# Patient Record
Sex: Female | Born: 1990 | Hispanic: Yes | Smoking: Never smoker
Health system: Southern US, Community
[De-identification: ages and names within clinical notes are randomized; demographics above are authoritative.]

## PROBLEM LIST (undated history)

## (undated) DIAGNOSIS — O24419 Gestational diabetes mellitus in pregnancy, unspecified control: Secondary | ICD-10-CM

## (undated) HISTORY — DX: Gestational diabetes mellitus in pregnancy, unspecified control: O24.419

---

## 2016-03-31 DIAGNOSIS — Q909 Down syndrome, unspecified: Secondary | ICD-10-CM

## 2018-02-05 NOTE — L&D Delivery Note (Signed)
Delivery Note At 5:47 PM a viable and healthy female "[name]" was delivered via Vaginal, Spontaneous (Presentation: Direct OA ).  APGAR: 8, 9; weight pending.   Placenta status: spontaneousl, intact.  Cord:3 VC with the following complications: none, though very little blood in cord and no cord pH able to be collected.  Anesthesia: epidural   Episiotomy: None Lacerations: None Est. Blood Loss (mL):  300  Mom to postpartum.  Baby to Couplet care / Skin to Skin.  28yo G2P1001 at 39+2wks presented in active labor. She was AROM for clear fluid at 14:50, and progressed to fully dilated. She pushed over intact perineum with an epidural for a short 2nd stage and delivered the fetal head, followed, with some effort by the fetal body. No shoulder dystocia. The baby was vigorous and crying, and placed on maternal abdomen. Delayed cord clamping and FOB cut the cord. Placenta delivered spontaneously and intact. Tiny right superior labial tear that was hemostatic and needed no suture repair. Mom and baby doing well. Fundus firm, pitocin running.   Benjaman Kindler 12/15/2018, 5:59 PM

## 2018-05-12 ENCOUNTER — Other Ambulatory Visit: Payer: Self-pay | Admitting: Family Medicine

## 2018-05-12 DIAGNOSIS — Z369 Encounter for antenatal screening, unspecified: Secondary | ICD-10-CM

## 2018-06-07 LAB — OB RESULTS CONSOLE GC/CHLAMYDIA
Chlamydia: NEGATIVE
Gonorrhea: NEGATIVE

## 2018-06-07 LAB — OB RESULTS CONSOLE RUBELLA ANTIBODY, IGM: Rubella: IMMUNE

## 2018-06-07 LAB — OB RESULTS CONSOLE ABO/RH: RH Type: POSITIVE

## 2018-06-07 LAB — OB RESULTS CONSOLE HGB/HCT, BLOOD: Hemoglobin: 13.3

## 2018-06-07 LAB — OB RESULTS CONSOLE VARICELLA ZOSTER ANTIBODY, IGG: Varicella: IMMUNE

## 2018-06-07 LAB — OB RESULTS CONSOLE ANTIBODY SCREEN: Antibody Screen: NEGATIVE

## 2018-06-07 LAB — SICKLE CELL SCREEN: Sickle Cell Screen: NEGATIVE

## 2018-06-07 LAB — OB RESULTS CONSOLE HEPATITIS B SURFACE ANTIGEN: Hepatitis B Surface Ag: NEGATIVE

## 2018-06-07 LAB — OB RESULTS CONSOLE RPR: RPR: NONREACTIVE

## 2018-06-07 LAB — OB RESULTS CONSOLE PLATELET COUNT: Platelets: 325

## 2018-06-07 LAB — OB RESULTS CONSOLE HIV ANTIBODY (ROUTINE TESTING): HIV: NONREACTIVE

## 2018-06-09 ENCOUNTER — Other Ambulatory Visit: Payer: Self-pay

## 2018-06-09 ENCOUNTER — Ambulatory Visit
Admission: RE | Admit: 2018-06-09 | Discharge: 2018-06-09 | Disposition: A | Discharge status: Home / Self Care | Payer: Self-pay | Source: Ambulatory Visit | Attending: Obstetrics and Gynecology | Admitting: Obstetrics and Gynecology

## 2018-06-09 DIAGNOSIS — Z8279 Family history of other congenital malformations, deformations and chromosomal abnormalities: Secondary | ICD-10-CM

## 2018-06-09 NOTE — Progress Notes (Signed)
Virtual Visit via Telephone Note  I connected with Iris Junius FinnerYessenia Munoz Cedillos on Jun 09, 2018 at 11:00 AM EDT by telephone and verified that I am speaking with the correct person using two identifiers.  Referring Provider:  Federico FlakeNewton, Kimberly Niles Length of Consultation: 40 minutes   Ms. Constance HolsterMunoz Cedillos was referred to [facility] for genetic counseling to discuss her family history and prenatal testing options.  This note is a summary of our discussion.  We first obtained a detailed family history. Ms. Constance HolsterMunoz Cedillos reported that her first child, Darwing Benson Norwaymil Perez Munoz, has Down syndrome. He is currently [age] years old and was diagnosed at birth in [place].  [facility] is [facility], where he had surgery for a cardiac defect.  With our patients permission, a review of his labs confirmed the diagnosis of trisomy 21 (47,XY +21) [facility]. There is no other family history of children with birth defects, developmental delays, multiple miscarriages or chromosome conditions.  She also denied any complications during this pregnancy or exposures to medications, recreational drugs, tobacco or alcohol. She reported that her husband has a sister with a tumor on her foot in her teens.  No additional information is known.  If more is learned about the type of tumor or cause for her condition, we are happy to discuss this further.  We reviewed that chromosomes are the inherited structures that contain our instructions for development (genes).  Each cell of our body normally has 46 chromosomes, matched up into 23 pairs.  The last pair determines our gender and are called the sex chromosomes.  A female has an X and a Y chromosome, while a female has two X chromosomes.  Rarely, when a mother's egg and father's sperm unite, an extra or missing chromosome can be passed on to the baby by mistake.  Changes in the number or the structure of the chromosomes may result in a child with some  degree of mental retardation and physical problems.  Down syndrome is caused by having three copies (instead of the usual two copies) of the genes on chromosome number 21.  There are two types of Down syndrome.  Most often (about 95% of the time), Down syndrome is caused by an entire third copy of chromosome 21, known as Trisomy 1321.  In the other cases, Down syndrome is caused by a rearrangement of the chromosomes, known as a translocation.    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  We would estimated that the chance for Ms. Maryelizabeth KaufmannMunoz Cedillos to have another child with Down syndrome or another chromosome condition is 1%. This means that there is therefore a 99% chance that this pregnancy would NOT have a chromosome difference.  We discussed the following prenatal screening and testing options for this pregnancy:  Cell free fetal DNA testing from maternal blood to may be used to determine whether or not the baby may have either Down syndrome, trisomy 6213, or trisomy 7818.  This test utilizes a maternal blood sample and DNA sequencing technology to isolate circulating cell free fetal DNA from maternal plasma.  The fetal DNA can then be analyzed for DNA sequences that are derived from the three most common chromosomes involved in aneuploidy, chromosomes 13, 18, and 21.  If the overall amount of DNA is greater than the expected  level for any of these chromosomes, aneuploidy is suspected.  While we do not consider it a replacement for invasive testing and karyotype analysis, a negative result from this testing would be reassuring, though not a guarantee of a normal chromosome complement for the baby.  An abnormal result is certainly suggestive of an abnormal chromosome complement, though we would still recommend CVS or amniocentesis to confirm any findings from this testing.  The  chorionic villus sampling procedure is available for first trimester chromosome analysis.  This involves the withdrawal of a small amount of chorionic villi (tissue from the developing placenta).  Risk of pregnancy loss is estimated to be approximately 1 in 200 to 1 in 100 (0.5 to 1%).  There is approximately a 1% (1 in 100) chance that the CVS chromosome results will be unclear.  Chorionic villi cannot be tested for neural tube defects.     Targeted ultrasound uses high frequency sound waves to create an image of the developing fetus.  An ultrasound is often recommended as a routine means of evaluating the pregnancy.  It is also used to screen for fetal anatomy problems (for example, a heart defect) that might be suggestive of a chromosomal or other abnormality.   Amniocentesis involves the removal of a small amount of amniotic fluid from the sac surrounding the fetus ("bag of water") with the use of a thin needle inserted through the mother's abdomen and uterus.  Ultrasound guidance is used throughout the procedure.  Fetal cells from amniotic fluid are directly evaluated and > 99.5% of chromosome problems and > 98% of open neural tube defects can be detected. This procedure is generally performed after the 15th week of pregnancy.  The main risks to this procedure include complications leading to miscarriage in less than 1 in 200 cases (0.5%).  First trimester screening and second trimester multiple marker screening were not discussed in detail, as cell free fetal DNA testing is more accurate for chromosome conditions.  Materal serum AFP only should be considered to assess for open neural tube defects.  Cystic Fibrosis and Spinal Muscular Atrophy (SMA) screening were also discussed with the patient. Both conditions are recessive, which means that both parents must be carriers in order to have a child with the disease.  Cystic fibrosis (CF) is one of the most common genetic conditions in persons of Caucasian  ancestry.  This condition occurs in approximately 1 in 2,500 Caucasian persons and results in thickened secretions in the lungs, digestive, and reproductive systems.  For a baby to be at risk for having CF, both of the parents must be carriers for this condition.  Approximately 1 in 79 Caucasian persons is a carrier for CF.  Current carrier testing looks for the most common mutations in the gene for CF and can detect approximately 90% of carriers in the Caucasian population.  This means that the carrier screening can greatly reduce, but cannot eliminate, the chance for an individual to have a child with CF.  If an individual is found to be a carrier for CF, then carrier testing would be available for the partner. As part of [place]'s newborn screening profile, all babies born in the state of [place] will have a two-tier screening process.  Specimens are first tested to determine the concentration of immunoreactive trypsinogen (IRT).  The top 5% of specimens with the highest IRT values then undergo DNA testing using a panel of over 40 common CF mutations. SMA is a neurodegenerative disorder that  leads to atrophy of skeletal muscle and overall weakness.  This condition is also more prevalent in the Caucasian population, with 1 in 40-1 in 60 persons being a carrier and 1 in 6,000-1 in 10,000 children being affected.  There are multiple forms of the disease, with some causing death in infancy to other forms with survival into adulthood.  The genetics of SMA is complex, but carrier screening can detect up to 95% of carriers in the Caucasian population.  Similar to CF, a negative result can greatly reduce, but cannot eliminate, the chance to have a child with SMA.  Hemoglobinopathy screening was also offered. The patient and her partner are both from [place].  We discussed the prenatal options in detail and after consideration, the patient elected to proceed with MaterniT21 PLUS with SCA to be drawn  following her ultrasound at [facility] of  on Thursday, May 7 at 1:00pm.  She declined carrier screening for hemoglobinopathies, CF and SMA.  We appreciate very much the opportunity to be involved with the care of this patient.  If the patient has further questions or concerns, please do not hesitate to call us at [phone].  Cherly Anderson, MS, CGC  Labs drawn: None today, to be drawn 5/7 - MaterniT21 PLUS with SCA  I provided 40 minutes of non-face-to-face time during this encounter.   Katrina Stack

## 2018-06-12 ENCOUNTER — Other Ambulatory Visit: Payer: Self-pay

## 2018-06-12 ENCOUNTER — Ambulatory Visit: Payer: Self-pay

## 2018-06-12 ENCOUNTER — Ambulatory Visit
Admission: RE | Admit: 2018-06-12 | Discharge: 2018-06-12 | Disposition: A | Discharge status: Home / Self Care | Payer: Self-pay | Source: Ambulatory Visit | Attending: Family Medicine | Admitting: Family Medicine

## 2018-06-12 DIAGNOSIS — Z3689 Encounter for other specified antenatal screening: Secondary | ICD-10-CM | POA: Insufficient documentation

## 2018-06-12 DIAGNOSIS — Z3A12 12 weeks gestation of pregnancy: Secondary | ICD-10-CM | POA: Insufficient documentation

## 2018-06-12 DIAGNOSIS — Z369 Encounter for antenatal screening, unspecified: Secondary | ICD-10-CM

## 2018-06-16 LAB — MATERNIT21 PLUS CORE+SCA
Fetal Fraction: 10
Monosomy X (Turner Syndrome): NOT DETECTED
Result (T21): NEGATIVE
Trisomy 13 (Patau syndrome): NEGATIVE
Trisomy 18 (Edwards syndrome): NEGATIVE
Trisomy 21 (Down syndrome): NEGATIVE
XXX (Triple X Syndrome): NOT DETECTED
XXY (Klinefelter Syndrome): NOT DETECTED
XYY (Jacobs Syndrome): NOT DETECTED

## 2018-06-16 NOTE — Progress Notes (Signed)
Hx reviewed by me, agree with assessment and plan as outlined in [facility] Wells's note. 

## 2018-06-19 ENCOUNTER — Telehealth: Payer: Self-pay | Admitting: Obstetrics and Gynecology

## 2018-06-19 NOTE — Telephone Encounter (Signed)
The patient was informed of the results of her recent MaterniT21 testing which yielded NEGATIVE results.  The patient's specimen showed DNA consistent with two copies of chromosomes 21, 18 and 13.  The sensitivity for trisomy 21, trisomy 18 and trisomy 13 using this testing are reported as 99.1%, 99.9% and 91.7% respectively.  Thus, while the results of this testing are highly accurate, they are not considered diagnostic at this time.  Should more definitive information be desired, the patient may still consider amniocentesis.   As requested to know by the patient, sex chromosome analysis was included for this sample.  Results are consistent with a female fetus (no Y chromosome material detected). This is predicted with >99% accuracy.  A maternal serum AFP only should be considered if screening for neural tube defects is desired.  We may be reached at [phone] with any questions or concerns.   Jenita Rayfield F. Asbury Hair, MS, CGC   

## 2018-07-21 ENCOUNTER — Other Ambulatory Visit: Payer: Self-pay

## 2018-07-21 DIAGNOSIS — Z8279 Family history of other congenital malformations, deformations and chromosomal abnormalities: Secondary | ICD-10-CM

## 2018-07-24 ENCOUNTER — Other Ambulatory Visit: Payer: Self-pay

## 2018-07-24 ENCOUNTER — Ambulatory Visit
Admission: RE | Admit: 2018-07-24 | Discharge: 2018-07-24 | Disposition: A | Discharge status: Home / Self Care | Payer: Self-pay | Source: Ambulatory Visit | Attending: Obstetrics and Gynecology | Admitting: Obstetrics and Gynecology

## 2018-07-24 DIAGNOSIS — Z8279 Family history of other congenital malformations, deformations and chromosomal abnormalities: Secondary | ICD-10-CM | POA: Insufficient documentation

## 2018-07-24 DIAGNOSIS — Z362 Encounter for other antenatal screening follow-up: Secondary | ICD-10-CM | POA: Insufficient documentation

## 2018-07-24 DIAGNOSIS — Z3A18 18 weeks gestation of pregnancy: Secondary | ICD-10-CM | POA: Insufficient documentation

## 2018-08-04 ENCOUNTER — Other Ambulatory Visit: Payer: Self-pay

## 2018-08-04 DIAGNOSIS — Z8279 Family history of other congenital malformations, deformations and chromosomal abnormalities: Secondary | ICD-10-CM

## 2018-08-07 ENCOUNTER — Ambulatory Visit
Admission: RE | Admit: 2018-08-07 | Discharge: 2018-08-07 | Disposition: A | Discharge status: Home / Self Care | Payer: Self-pay | Source: Ambulatory Visit | Attending: Obstetrics and Gynecology | Admitting: Obstetrics and Gynecology

## 2018-08-07 ENCOUNTER — Other Ambulatory Visit: Payer: Self-pay

## 2018-08-07 DIAGNOSIS — Z8279 Family history of other congenital malformations, deformations and chromosomal abnormalities: Secondary | ICD-10-CM | POA: Insufficient documentation

## 2018-08-07 DIAGNOSIS — Z3689 Encounter for other specified antenatal screening: Secondary | ICD-10-CM | POA: Insufficient documentation

## 2018-08-07 DIAGNOSIS — Z3A20 20 weeks gestation of pregnancy: Secondary | ICD-10-CM | POA: Insufficient documentation

## 2018-08-18 ENCOUNTER — Encounter: Payer: Self-pay | Admitting: Family Medicine

## 2018-08-18 DIAGNOSIS — Z8759 Personal history of other complications of pregnancy, childbirth and the puerperium: Secondary | ICD-10-CM | POA: Insufficient documentation

## 2018-08-18 DIAGNOSIS — O0990 Supervision of high risk pregnancy, unspecified, unspecified trimester: Secondary | ICD-10-CM

## 2018-08-18 DIAGNOSIS — Z8279 Family history of other congenital malformations, deformations and chromosomal abnormalities: Secondary | ICD-10-CM

## 2018-08-29 ENCOUNTER — Other Ambulatory Visit: Payer: Self-pay

## 2018-08-29 ENCOUNTER — Ambulatory Visit: Payer: Medicaid Other

## 2018-08-29 ENCOUNTER — Ambulatory Visit: Payer: Self-pay | Admitting: Family Medicine

## 2018-08-29 DIAGNOSIS — Z862 Personal history of diseases of the blood and blood-forming organs and certain disorders involving the immune mechanism: Secondary | ICD-10-CM

## 2018-08-29 DIAGNOSIS — Z8759 Personal history of other complications of pregnancy, childbirth and the puerperium: Secondary | ICD-10-CM

## 2018-08-29 DIAGNOSIS — R87610 Atypical squamous cells of undetermined significance on cytologic smear of cervix (ASC-US): Secondary | ICD-10-CM

## 2018-08-29 DIAGNOSIS — O0990 Supervision of high risk pregnancy, unspecified, unspecified trimester: Secondary | ICD-10-CM

## 2018-08-29 NOTE — Progress Notes (Signed)
   TELEPHONE OBSTETRICS VISIT ENCOUNTER NOTE  I connected with@ on 08/29/18 at 10:20 AM EDT by telephone at home and verified that I am speaking with the correct person using two identifiers.   I discussed the limitations, risks, security and privacy concerns of performing an evaluation and management service by telephone and the availability of in person appointments. I also discussed with the patient that there may be a patient responsible charge related to this service. The patient expressed understanding and agreed to proceed.  Subjective:  Tracey Thompson is a 28 y.o. G2P1001 at [redacted]w[redacted]d being followed for ongoing prenatal care.  She is currently monitored for the following issues for this low-risk pregnancy and has Family history of Down syndrome; Supervision of high risk pregnancy, antepartum; and History of postpartum hemorrhage on their problem list.  Patient reports no complaints. Reports fetal movement. Denies any contractions, bleeding. See RN note. Patient reported leaking and I reviewed sx with patient this is likely normal vaginal discharge vs urine. Patient reassured  The following portions of the patient's history were reviewed and updated as appropriate: allergies, current medications, past family history, past medical history, past social history, past surgical history and problem list.   Objective:   General:  Alert, oriented and cooperative.   Mental Status: Normal mood and affect perceived. Normal judgment and thought content.  Rest of physical exam deferred due to type of encounter  Assessment and Plan:  Pregnancy: G2P1001 at [redacted]w[redacted]d 1. Supervision of high risk pregnancy, antepartum Up to date Next appt will have 28 wk labs Discussed testing to be done at next visit   2. History of postpartum hemorrhage LD aware  Continue to monitor anemia to assure hgb is maximized prior to delivery  Preterm labor symptoms and general obstetric precautions including but  not limited to vaginal bleeding, contractions, leaking of fluid and fetal movement were reviewed in detail with the patient.  I discussed the assessment and treatment plan with the patient. The patient was provided an opportunity to ask questions and all were answered. The patient agreed with the plan and demonstrated an understanding of the instructions. The patient was advised to call back or seek an in-person office evaluation/go to the hospital for any urgent or concerning symptoms.  Please refer to After Visit Summary for other counseling recommendations.   I provided 7 minutes of non-face-to-face time during this encounter.  Return in about 4 weeks (around 09/26/2018) for Routine prenatal care, 28 wk labs.  Future Appointments  Date Time Provider [place]  09/26/2018  2:20 PM [facility] PROVIDER [facility] None    Caren Macadam, MD

## 2018-08-29 NOTE — Progress Notes (Signed)
Call to client regarding Telehealth visit; confirmed pt. Identity and discussed limitations of visit and informed is a billable visit-agrees to continue with visit; undecided on [facility]; informed of labs needed @ next visit; taking PNV; CCNC/ PHQ9 screening reviewed via interpreter. C/o panties "feeling wet" frequently-denies clothes being wet or anything running down legs-has tried using pantyliner and thinks it is urine--discussed Kegel's and advised if ?ROM to call [facility] or L & D-agreed.

## 2018-09-26 ENCOUNTER — Encounter: Payer: Self-pay | Admitting: Advanced Practice Midwife

## 2018-09-26 ENCOUNTER — Other Ambulatory Visit: Payer: Self-pay

## 2018-09-26 ENCOUNTER — Ambulatory Visit: Payer: Medicaid Other

## 2018-09-26 ENCOUNTER — Ambulatory Visit: Payer: Self-pay | Admitting: Advanced Practice Midwife

## 2018-09-26 VITALS — BP 111/69 | Temp 97.5°F | Wt 167.2 lb

## 2018-09-26 DIAGNOSIS — O0990 Supervision of high risk pregnancy, unspecified, unspecified trimester: Secondary | ICD-10-CM

## 2018-09-26 DIAGNOSIS — Z3483 Encounter for supervision of other normal pregnancy, third trimester: Secondary | ICD-10-CM

## 2018-09-26 LAB — HEMOGLOBIN, FINGERSTICK: Hemoglobin: 13.2 g/dL (ref 11.1–15.9)

## 2018-09-26 LAB — OB RESULTS CONSOLE HIV ANTIBODY (ROUTINE TESTING): HIV: NONREACTIVE

## 2018-09-26 NOTE — Progress Notes (Signed)
   PRENATAL VISIT NOTE  Subjective:  Tracey Thompson is a 28 y.o. G2P1001 at [redacted]w[redacted]d being seen today for ongoing prenatal care.  She is currently monitored for the following issues for this low-risk pregnancy and has Family history of Down syndrome; Supervision of high risk pregnancy, antepartum; History of postpartum hemorrhage; and ASCUS of cervix with negative high risk HPV on their problem list.  Patient reports no complaints.  Contractions: Not present. Vag. Bleeding: None.  Movement: Present. Denies leaking of fluid/ROM.   The following portions of the patient's history were reviewed and updated as appropriate: allergies, current medications, past family history, past medical history, past social history, past surgical history and problem list. Problem list updated.  Objective:   Vitals:   09/26/18 1422  BP: 111/69  Temp: (!) 97.5 F (36.4 C)  Weight: 167 lb 3.2 oz (75.8 kg)    Fetal Status: Fetal Heart Rate (bpm): 140 Fundal Height: 28 cm Movement: Present     General:  Alert, oriented and cooperative. Patient is in no acute distress.  Skin: Skin is warm and dry. No rash noted.   Cardiovascular: Normal heart rate noted  Respiratory: Normal respiratory effort, no problems with respiration noted  Abdomen: Soft, gravid, appropriate for gestational age.  Pain/Pressure: Absent     Pelvic: Cervical exam deferred        Extremities: Normal range of motion.  Edema: None  Mental Status: Normal mood and affect. Normal behavior. Normal judgment and thought content.   Assessment and Plan:  Pregnancy: G2P1001 at [redacted]w[redacted]d  1. Supervision of high risk pregnancy, antepartum Feels well. Unemployed.  1 hr glucola today.  To receive BP cuff for Centering   Preterm labor symptoms and general obstetric precautions including but not limited to vaginal bleeding, contractions, leaking of fluid and fetal movement were reviewed in detail with the patient. Please refer to After Visit  Summary for other counseling recommendations.  Return in about 2 weeks (around 10/10/2018) for telehealth, routine PNC.  No future appointments.  Herbie Saxon, CNM

## 2018-09-26 NOTE — Addendum Note (Signed)
Addended by: Jenetta Downer on: 09/26/2018 04:58 PM   Modules accepted: Orders

## 2018-09-26 NOTE — Progress Notes (Signed)
Patient here at 68 6/7, for 28 week labs and Tdap. Tdap given left deltoid, tolerated well, VIS given. Patient to schedule next appointment for Telehealth, and patient states she does not have a BP cuff. Per Ellison Carwin, BP cuffs are for Centering patients only at this time.Jenetta Downer, RN

## 2018-09-27 LAB — RPR: RPR Ser Ql: NONREACTIVE

## 2018-09-27 LAB — GLUCOSE, 1 HOUR GESTATIONAL: Gestational Diabetes Screen: 202 mg/dL — ABNORMAL HIGH (ref 65–139)

## 2018-09-29 ENCOUNTER — Telehealth: Payer: Self-pay

## 2018-09-29 DIAGNOSIS — O99810 Abnormal glucose complicating pregnancy: Secondary | ICD-10-CM | POA: Insufficient documentation

## 2018-09-29 NOTE — Telephone Encounter (Signed)
Call to client-discussed abnl 1 hr and scheduled for 3 Hr Gtt 10/02/18; instructions given via interpreter M. [place] Tracey Mccosh, RN

## 2018-10-02 ENCOUNTER — Other Ambulatory Visit: Payer: Self-pay

## 2018-10-02 VITALS — Temp 97.4°F

## 2018-10-02 DIAGNOSIS — O0990 Supervision of high risk pregnancy, unspecified, unspecified trimester: Secondary | ICD-10-CM

## 2018-10-02 NOTE — Progress Notes (Signed)
Patient in nurse clinic today for 3 hour GTT. Instructions given, patient sent to lab. No history of travel noted. Matvey Llanas, RN   

## 2018-10-03 LAB — GLUCOSE TOLERANCE TEST, 6 HOUR
Glucose, 1 Hour GTT: 190 mg/dL (ref 65–199)
Glucose, 2 hour: 173 mg/dL — ABNORMAL HIGH (ref 65–139)
Glucose, 3 hour: 140 mg/dL — ABNORMAL HIGH (ref 65–109)
Glucose, GTT - Fasting: 88 mg/dL (ref 65–99)

## 2018-10-06 ENCOUNTER — Other Ambulatory Visit: Payer: Self-pay | Admitting: Advanced Practice Midwife

## 2018-10-06 ENCOUNTER — Telehealth: Payer: Self-pay

## 2018-10-06 DIAGNOSIS — O24410 Gestational diabetes mellitus in pregnancy, diet controlled: Secondary | ICD-10-CM

## 2018-10-06 DIAGNOSIS — O24419 Gestational diabetes mellitus in pregnancy, unspecified control: Secondary | ICD-10-CM | POA: Insufficient documentation

## 2018-10-06 MED ORDER — BLOOD GLUCOSE MONITOR KIT: PACK | 0 refills | Status: DC

## 2018-10-06 NOTE — Telephone Encounter (Signed)
Call to client, per E. Sciora, CNM, & discussed GDM dg. Via interpreter M. [place] informed of Lifestyle referral and instructions given Debera Lat, RN

## 2018-10-10 ENCOUNTER — Ambulatory Visit: Payer: Self-pay | Admitting: Advanced Practice Midwife

## 2018-10-10 DIAGNOSIS — O0990 Supervision of high risk pregnancy, unspecified, unspecified trimester: Secondary | ICD-10-CM

## 2018-10-10 DIAGNOSIS — O24410 Gestational diabetes mellitus in pregnancy, diet controlled: Secondary | ICD-10-CM

## 2018-10-10 NOTE — Progress Notes (Signed)
   TELEPHONE OBSTETRICS VISIT ENCOUNTER NOTE  I connected with Tracey Thompson @ on 10/10/18 at  2:40 PM EDT by telephone at gas station and verified that I am speaking with the correct person using two identifiers.   I discussed the limitations, risks, security and privacy concerns of performing an evaluation and management service by telephone and the availability of in person appointments. I also discussed with the patient that there may be a patient responsible charge related to this service. The patient expressed understanding and agreed to proceed.  Subjective:  Tracey Krithi Bray is a 28 y.o. G2P1001 at [redacted]w[redacted]d being followed for ongoing prenatal care.  She is currently monitored for the following issues for this high-risk pregnancy and has Family history of Down syndrome; Supervision of high risk pregnancy, antepartum; History of postpartum hemorrhage; ASCUS of cervix with negative high risk HPV; Abnormal glucose tolerance test during pregnancy, antepartum; and Gestational diabetes mellitus (GDM), antepartum on their problem list.  Patient reports no complaints. Reports fetal movement. Denies any contractions, bleeding or leaking of fluid.   The following portions of the patient's history were reviewed and updated as appropriate: allergies, current medications, past family history, past medical history, past social history, past surgical history and problem list.   Objective:   General:  Alert, oriented and cooperative.   Mental Status: Normal mood and affect perceived. Normal judgment and thought content.  Rest of physical exam deferred due to type of encounter  Assessment and Plan:  Pregnancy: G2P1001 at [redacted]w[redacted]d 1. Diet controlled gestational diabetes mellitus (GDM), antepartum Newly diagnosed gestational diabetic.  Pt plans to go to [facility] appt on 10/16/18  2. Supervision of high risk pregnancy, antepartum Feels well.  Will be seen weekly in clinic due to  diabetes dx.  Preterm labor symptoms and general obstetric precautions including but not limited to vaginal bleeding, contractions, leaking of fluid and fetal movement were reviewed in detail with the patient.  I discussed the assessment and treatment plan with the patient. The patient was provided an opportunity to ask questions and all were answered. The patient agreed with the plan and demonstrated an understanding of the instructions. The patient was advised to call back or seek an in-person office evaluation/go to the hospital for any urgent or concerning symptoms.  Please refer to After Visit Summary for other counseling recommendations.   I provided 9 minutes of non-face-to-face time during this encounter.  Return in about 1 week (around 10/17/2018) for Needs u/a and make a copy of blood sugar log.  Future Appointments  Date Time Provider [place]  10/16/2018  1:45 PM Shot[place], Guadalupe Maple, RN [facility] None  10/24/2018  3:20 PM [facility] PROVIDER [facility] None    Herbie Saxon, CNM

## 2018-10-10 NOTE — Progress Notes (Signed)
Call for [facility] Telehealth visit; confirmed identity; aware of 10/16/18 [facility] appt; denies hospital visits; undecided on [facility] Debera Lat, RN

## 2018-10-16 ENCOUNTER — Encounter: Payer: Self-pay | Admitting: *Deleted

## 2018-10-16 ENCOUNTER — Encounter: Payer: Self-pay | Attending: Advanced Practice Midwife | Admitting: *Deleted

## 2018-10-16 ENCOUNTER — Other Ambulatory Visit: Payer: Self-pay

## 2018-10-16 VITALS — BP 104/70 | Ht 63.0 in | Wt 165.8 lb

## 2018-10-16 DIAGNOSIS — Z3A00 Weeks of gestation of pregnancy not specified: Secondary | ICD-10-CM | POA: Insufficient documentation

## 2018-10-16 DIAGNOSIS — O24410 Gestational diabetes mellitus in pregnancy, diet controlled: Secondary | ICD-10-CM | POA: Insufficient documentation

## 2018-10-16 NOTE — Progress Notes (Signed)
Diabetes Self-Management Education  Visit Type: First/Initial  Appt. Start Time: 1345 Appt. End Time: 1540  10/16/2018  [name], identified by name and date of birth, is a 28 y.o. female with a diagnosis of Diabetes: Gestational Diabetes.   ASSESSMENT  Blood pressure 104/70, height 5\' 3"  (1.6 m), weight 165 lb 12.8 oz (75.2 kg), last menstrual period 03/15/2018, estimated date of delivery 12/20/2018 Body mass index is 29.37 kg/m.  Diabetes Self-Management Education - 10/16/18 1635      Visit Information   Visit Type  First/Initial      Initial Visit   Diabetes Type  Gestational Diabetes    Are you currently following a meal plan?  Yes    What type of meal plan do you follow?  "stopped eating so much rice and sugary beverages"    Are you taking your medications as prescribed?  Yes    Date Diagnosed  2 weeks ago      Health Coping   How would you rate your overall health?  Good      Psychosocial Assessment   Patient Belief/Attitude about Diabetes  Other (comment)   "concerned"   Self-care barriers  English as a second language    Self-management support  Doctor's office;Family    Other persons present  Interpreter    Patient Concerns  Nutrition/Meal planning;Healthy Lifestyle;Monitoring    Special Needs  Other (comment)   education materials in Spanish   Preferred Learning Style  Auditory;Visual    Learning Readiness  Change in progress    How often do you need to have someone help you when you read instructions, pamphlets, or other written materials from your doctor or pharmacy?  2 - Rarely   if written in Spanish     Pre-Education Assessment   Patient understands the diabetes disease and treatment process.  Needs Instruction    Patient understands incorporating nutritional management into lifestyle.  Needs Instruction    Patient undertands incorporating physical activity into lifestyle.  Needs Instruction    Patient understands using  medications safely.  Needs Instruction    Patient understands monitoring blood glucose, interpreting and using results  Needs Instruction    Patient understands prevention, detection, and treatment of acute complications.  Needs Instruction    Patient understands prevention, detection, and treatment of chronic complications.  Needs Instruction    Patient understands how to develop strategies to address psychosocial issues.  Needs Instruction    Patient understands how to develop strategies to promote health/change behavior.  Needs Instruction      Complications   How often do you check your blood sugar?  0 times/day (not testing)   Provided Accu-Chek Guide Me meter and instructed on use. BG upon return demonstration was 71 mg/dL at 8:653:30 pm - 6 1/2 hrs pp.   Have you had a dilated eye exam in the past 12 months?  No    Have you had a dental exam in the past 12 months?  No    Are you checking your feet?  Yes    How many days per week are you checking your feet?  2      Dietary Intake   Breakfast  oatmeal; bread and milk    Lunch  boiled egg, tomatoes, peas    Dinner  fish and occasional pork; tortillas, beans, rice, carrots, peas, cabbage    Beverage(s)  water,      Exercise   Exercise Type  Light (walking /  raking leaves)    How many days per week to you exercise?  2    How many minutes per day do you exercise?  30    Total minutes per week of exercise  60      Patient Education   Previous Diabetes Education  No    Disease state   Definition of diabetes, type 1 and 2, and the diagnosis of diabetes;Factors that contribute to the development of diabetes    Nutrition management   Role of diet in the treatment of diabetes and the relationship between the three main macronutrients and blood glucose level;Reviewed blood glucose goals for pre and post meals and how to evaluate the patients' food intake on their blood glucose level.    Physical activity and exercise   Role of exercise on  diabetes management, blood pressure control and cardiac health.    Medications  Other (comment)   limited use of oral medications during pregnancy and potential for insulin use   Monitoring  Taught/evaluated SMBG meter.;Purpose and frequency of SMBG.;Taught/discussed recording of test results and interpretation of SMBG.;Identified appropriate SMBG and/or A1C goals.;Ketone testing, when, how.    Chronic complications  Relationship between chronic complications and blood glucose control    Psychosocial adjustment  Identified and addressed patients feelings and concerns about diabetes    Preconception care  Pregnancy and GDM  Role of pre-pregnancy blood glucose control on the development of the fetus;Reviewed with patient blood glucose goals with pregnancy;Role of family planning for patients with diabetes      Individualized Goals (developed by patient)   Reducing Risk Prevent diabetes complications Lead a healthier lifestyle     Outcomes   Expected Outcomes  Demonstrated interest in learning. Expect positive outcomes    Future DMSE  Other (comment)   No further appts scheduled because she doesn't have insurance.   Program Status  Not Completed       Individualized Plan for Diabetes Self-Management Training:   Learning Objective:  Patient will have a greater understanding of diabetes self-management. Patient education plan is to attend individual and/or group sessions per assessed needs and concerns.   Plan:   Patient Instructions  Read booklet on Gestational Diabetes Follow Gestational Meal Planning Guidelines Check blood sugars 4 x day - before breakfast and 2 hrs after every meal and record  Bring blood sugar log to all MD appointments Strips   Accu-Chek Guide  Lancets   Accu-Chek FastClix Purchase urine ketone strips if ordered by MD and check urine ketones every am:  If + increase bedtime snack to 1 protein and 2 carbohydrate servings Walk 20-30 minutes at least 5 x week if  permitted by MD  Expected Outcomes:  Demonstrated interest in learning. Expect positive outcomes  Education material provided:  Gestational Booklet (Spanish) Gestational Meal Planning Guidelines (Spanish) Simple Meal Plan (Spanish) Viewed Gestational Diabetes Video (Spanish) Meter = Accu-Chek Guide Me Goals for a Healthy Pregnancy (Spanish)  If problems or questions, patient to contact team via:  Johny Drilling, RN, CCM, CDE [phone]  Future DSME appointment: Other (No further appts scheduled because she doesn't have insurance.)

## 2018-10-16 NOTE — Patient Instructions (Signed)
Read booklet on Gestational Diabetes Follow Gestational Meal Planning Guidelines Check blood sugars 4 x day - before breakfast and 2 hrs after every meal and record  Bring blood sugar log to all MD appointments Strips   Accu-Chek Guide  Lancets   Accu-Chek FastClix Purchase urine ketone strips if ordered by MD and check urine ketones every am:  If + increase bedtime snack to 1 protein and 2 carbohydrate servings Walk 20-30 minutes at least 5 x week if permitted by MD

## 2018-10-23 NOTE — Addendum Note (Signed)
Addended by: Cletis Media on: 10/23/2018 11:31 AM   Modules accepted: Orders

## 2018-10-24 ENCOUNTER — Encounter: Payer: Self-pay | Admitting: Family Medicine

## 2018-10-24 ENCOUNTER — Ambulatory Visit: Payer: Self-pay | Admitting: Family Medicine

## 2018-10-24 ENCOUNTER — Other Ambulatory Visit: Payer: Self-pay

## 2018-10-24 VITALS — BP 113/75 | Temp 97.2°F | Wt 165.0 lb

## 2018-10-24 DIAGNOSIS — Z862 Personal history of diseases of the blood and blood-forming organs and certain disorders involving the immune mechanism: Secondary | ICD-10-CM

## 2018-10-24 DIAGNOSIS — O0990 Supervision of high risk pregnancy, unspecified, unspecified trimester: Secondary | ICD-10-CM

## 2018-10-24 DIAGNOSIS — Z8759 Personal history of other complications of pregnancy, childbirth and the puerperium: Secondary | ICD-10-CM

## 2018-10-24 DIAGNOSIS — O24410 Gestational diabetes mellitus in pregnancy, diet controlled: Secondary | ICD-10-CM

## 2018-10-24 LAB — URINALYSIS
Bilirubin, UA: NEGATIVE
Glucose, UA: NEGATIVE
Leukocytes,UA: NEGATIVE
Nitrite, UA: NEGATIVE
Protein,UA: NEGATIVE
RBC, UA: NEGATIVE
Specific Gravity, UA: 1.025 (ref 1.005–1.030)
Urobilinogen, Ur: 0.2 mg/dL (ref 0.2–1.0)
pH, UA: 6 (ref 5.0–7.5)

## 2018-10-24 NOTE — Progress Notes (Signed)
Taking PNV QD. Denies ED/hospital visits since RV Kick Count cards and instructions given.Hal Morales, RN

## 2018-10-24 NOTE — Progress Notes (Signed)
   PRENATAL VISIT NOTE  Subjective:  Tracey Thompson is a 28 y.o. G2P1001 at [redacted]w[redacted]d being seen today for ongoing prenatal care.  She is currently monitored for the following issues for this high-risk pregnancy and has Family history of Down syndrome; Supervision of high risk pregnancy, antepartum; History of postpartum hemorrhage; ASCUS of cervix with negative high risk HPV; Abnormal glucose tolerance test during pregnancy, antepartum; and Gestational diabetes mellitus (GDM), antepartum on their problem list.  Patient reports no complaints.  Contractions: Not present. Vag. Bleeding: None.  Movement: Present. Denies leaking of fluid/ROM.   The following portions of the patient's history were reviewed and updated as appropriate: allergies, current medications, past family history, past medical history, past social history, past surgical history and problem list. Problem list updated.  Objective:   Vitals:   10/24/18 1544  BP: 113/75  Temp: (!) 97.2 F (36.2 C)  Weight: 165 lb (74.8 kg)    Fetal Status: Fetal Heart Rate (bpm): 140 Fundal Height: 31 cm Movement: Present     General:  Alert, oriented and cooperative. Patient is in no acute distress.  Skin: Skin is warm and dry. No rash noted.   Cardiovascular: Normal heart rate noted  Respiratory: Normal respiratory effort, no problems with respiration noted  Abdomen: Soft, gravid, appropriate for gestational age.  Pain/Pressure: Absent     Pelvic: Cervical exam deferred        Extremities: Normal range of motion.  Edema: None  Mental Status: Normal mood and affect. Normal behavior. Normal judgment and thought content.   Assessment and Plan:  Pregnancy: G2P1001 at [redacted]w[redacted]d  1. Diet controlled gestational diabetes mellitus (GDM), antepartum - BS Fastings are 77-110 with  5/7 above 95 - PP 73-146 with only 5 of 21 above 120 - discussed targeting fasting with walking after dinner  - Return in 1 week, if still having elevated  fastings, recommend starting Metformin.  If medication started will need to start NSTs, need EFW at 36 wk and IOL at 39 weeks - Urinalysis (Urine Dip)  2. Supervision of high risk pregnancy, antepartum Up to date - Urinalysis (Urine Dip)  4. History of postpartum hemorrhage LD aware   Preterm labor symptoms and general obstetric precautions including but not limited to vaginal bleeding, contractions, leaking of fluid and fetal movement were reviewed in detail with the patient. Please refer to After Visit Summary for other counseling recommendations.   Return in about 4 weeks (around 11/21/2018) for Routine prenatal care, BG check.  Future Appointments  Date Time Provider [place]  10/31/2018  2:40 PM [facility] PROVIDER [facility] None    Caren Macadam, MD

## 2018-10-31 ENCOUNTER — Other Ambulatory Visit: Payer: Self-pay

## 2018-10-31 ENCOUNTER — Ambulatory Visit: Payer: Self-pay | Admitting: Family Medicine

## 2018-10-31 DIAGNOSIS — Z862 Personal history of diseases of the blood and blood-forming organs and certain disorders involving the immune mechanism: Secondary | ICD-10-CM

## 2018-10-31 DIAGNOSIS — O24410 Gestational diabetes mellitus in pregnancy, diet controlled: Secondary | ICD-10-CM

## 2018-10-31 DIAGNOSIS — O0990 Supervision of high risk pregnancy, unspecified, unspecified trimester: Secondary | ICD-10-CM

## 2018-10-31 DIAGNOSIS — Z8759 Personal history of other complications of pregnancy, childbirth and the puerperium: Secondary | ICD-10-CM

## 2018-10-31 LAB — URINALYSIS
Bilirubin, UA: NEGATIVE
Glucose, UA: NEGATIVE
Nitrite, UA: NEGATIVE
Protein,UA: NEGATIVE
RBC, UA: NEGATIVE
Specific Gravity, UA: 1.02 (ref 1.005–1.030)
Urobilinogen, Ur: 0.2 mg/dL (ref 0.2–1.0)
pH, UA: 7 (ref 5.0–7.5)

## 2018-10-31 MED ORDER — PRENATAL VITAMINS 28-0.8 MG PO TABS: 1.0000 | ORAL_TABLET | Freq: Every day | ORAL | 0 refills | Status: DC

## 2018-10-31 NOTE — Progress Notes (Signed)
   PRENATAL VISIT NOTE  Subjective:  Tracey Thompson is a 28 y.o. G2P1001 at [redacted]w[redacted]d being seen today for ongoing prenatal care.  She is currently monitored for the following issues for this high-risk pregnancy and has Family history of Down syndrome; Supervision of high risk pregnancy, antepartum; History of postpartum hemorrhage; ASCUS of cervix with negative high risk HPV; and Gestational diabetes mellitus (GDM), antepartum on their problem list.  Patient reports no complaints.  Contractions: Not present. Vag. Bleeding: None.  Movement: Present. Denies leaking of fluid/ROM.   The following portions of the patient's history were reviewed and updated as appropriate: allergies, current medications, past family history, past medical history, past social history, past surgical history and problem list. Problem list updated.  Objective:   Vitals:   10/31/18 1506  BP: 110/70  Temp: 97.8 F (36.6 C)  Weight: 164 lb 6.4 oz (74.6 kg)    Fetal Status: Fetal Heart Rate (bpm): 140 Fundal Height: 33 cm Movement: Present  Presentation: Vertex  General:  Alert, oriented and cooperative. Patient is in no acute distress.  Skin: Skin is warm and dry. No rash noted.   Cardiovascular: Normal heart rate noted  Respiratory: Normal respiratory effort, no problems with respiration noted  Abdomen: Soft, gravid, appropriate for gestational age.  Pain/Pressure: Absent     Pelvic: Cervical exam deferred        Extremities: Normal range of motion.  Edema: None  Mental Status: Normal mood and affect. Normal behavior. Normal judgment and thought content.   Assessment and Plan:  Pregnancy: G2P1001 at [redacted]w[redacted]d  1. Diet controlled gestational diabetes mellitus (GDM), antepartum - Log today shows good control-- all fastings reviewed 0/7 were elevated. PP only 4 of 21. Reviewed diet and triggers to sugars. Patient is compliant with diet and knows reasons for elevated sugars.  Reviewed next visit in 2 wks  given excellent control and to call if values are routinely above goal.  - Reviewed recommendation for EFW at 36 wks and patient is amenable.  [place] ordered at [facility], plan for 4 wks from today. Discussed payment options at [facility] - Urinalysis (Urine Dip)  2. Supervision of high risk pregnancy, antepartum - Up to date - Urinalysis (Urine Dip) - Prenatal Vit-Fe Fumarate-FA (PRENATAL VITAMINS) 28-0.8 MG TABS; Take 1 tablet by mouth daily.  Dispense: 100 tablet; Refill: 0  3. History of postpartum hemorrhage LD aware   Preterm labor symptoms and general obstetric precautions including but not limited to vaginal bleeding, contractions, leaking of fluid and fetal movement were reviewed in detail with the patient. Please refer to After Visit Summary for other counseling recommendations.  Return in about 2 weeks (around 11/14/2018) for Routine prenatal care, in person- GDM log review.  Future Appointments  Date Time Provider [place]  11/14/2018  3:00 PM [facility] PROVIDER [facility] None    Caren Macadam, MD

## 2018-10-31 NOTE — Progress Notes (Signed)
Taking PNV QD, denies ED/hospital visits since last RV. More PNV's given Undecided regarding PP birth control method. Hal Morales, RN

## 2018-11-06 ENCOUNTER — Telehealth: Payer: Self-pay

## 2018-11-06 NOTE — Telephone Encounter (Signed)
TC to patient to inform of [place] u/s on 11/21/2018 at 10:30am. Interpreter M. [place] ([place]). Patient informed to take $21 with her to appointment and to arrive at 10:15am. Patient states understanding.Jenetta Downer, RN

## 2018-11-14 ENCOUNTER — Ambulatory Visit: Payer: Self-pay | Admitting: Advanced Practice Midwife

## 2018-11-14 ENCOUNTER — Other Ambulatory Visit: Payer: Self-pay

## 2018-11-14 DIAGNOSIS — O24410 Gestational diabetes mellitus in pregnancy, diet controlled: Secondary | ICD-10-CM

## 2018-11-14 DIAGNOSIS — O0990 Supervision of high risk pregnancy, unspecified, unspecified trimester: Secondary | ICD-10-CM

## 2018-11-14 LAB — URINALYSIS
Bilirubin, UA: NEGATIVE
Glucose, UA: NEGATIVE
Ketones, UA: NEGATIVE
Nitrite, UA: NEGATIVE
Protein,UA: NEGATIVE
RBC, UA: NEGATIVE
Specific Gravity, UA: 1.02 (ref 1.005–1.030)
Urobilinogen, Ur: 0.2 mg/dL (ref 0.2–1.0)
pH, UA: 7 (ref 5.0–7.5)

## 2018-11-14 NOTE — Progress Notes (Signed)
   PRENATAL VISIT NOTE  Subjective:  Tracey Thompson is a 28 y.o. G2P1001 at [redacted]w[redacted]d being seen today for ongoing prenatal care.  She is currently monitored for the following issues for this high-risk pregnancy and has Family history of Down syndrome; Supervision of high risk pregnancy, antepartum; History of postpartum hemorrhage; ASCUS of cervix with negative high risk HPV; and Gestational diabetes mellitus (GDM), antepartum on their problem list.  Patient reports no complaints.  Contractions: Not present. Vag. Bleeding: None.  Movement: Present. Denies leaking of fluid/ROM.   The following portions of the patient's history were reviewed and updated as appropriate: allergies, current medications, past family history, past medical history, past social history, past surgical history and problem list. Problem list updated.  Objective:   Vitals:   11/14/18 1539  BP: 105/66  Temp: (!) 96.9 F (36.1 C)  Weight: 164 lb (74.4 kg)    Fetal Status: Fetal Heart Rate (bpm): 130 Fundal Height: 34 cm Movement: Present  Presentation: Vertex  General:  Alert, oriented and cooperative. Patient is in no acute distress.  Skin: Skin is warm and dry. No rash noted.   Cardiovascular: Normal heart rate noted  Respiratory: Normal respiratory effort, no problems with respiration noted  Abdomen: Soft, gravid, appropriate for gestational age.  Pain/Pressure: Absent     Pelvic: Cervical exam deferred        Extremities: Normal range of motion.  Edema: None  Mental Status: Normal mood and affect. Normal behavior. Normal judgment and thought content.   Assessment and Plan:  Pregnancy: G2P1001 at [redacted]w[redacted]d  1. Diet controlled gestational diabetes mellitus (GDM), antepartum Diagnosed 10/02/18.   Attended [facility] 10/16/18.  Has [place] growth u/s 11/21/18.  FBS: all wnl (0/15).  2 hr pp: 3/44 Breakfast today: oatmeal, water.  Lunch: liver with onions, rice, 1 tortilla, carrots, potatoes, water.  Dinner  last night: 1 egg, beans, onion, 2 tortillas, carrots, potatoes, water.  Snack before bed: chips - Urinalysis (Urine Dip)  2. Supervision of high risk pregnancy, antepartum Feels well.  Unsure about flu vax.  Not working.   Preterm labor symptoms and general obstetric precautions including but not limited to vaginal bleeding, contractions, leaking of fluid and fetal movement were reviewed in detail with the patient. Please refer to After Visit Summary for other counseling recommendations.  Return in about 1 week (around 11/21/2018) for routine PNC.  No future appointments.  Herbie Saxon, CNM

## 2018-11-14 NOTE — Progress Notes (Addendum)
Patient here for maternity visit at 23 6/7. Urine dip today and BS log copied. Still unsure about BCM. Patient would like flu vaccine at next visit. Patient has [place] U/S on 11/21/2018 at 10:30am, patient aware. Urine dip reviewed by provider, no new orders.Jenetta Downer, RN

## 2018-11-20 ENCOUNTER — Ambulatory Visit: Payer: Self-pay

## 2018-11-21 ENCOUNTER — Other Ambulatory Visit: Payer: Self-pay

## 2018-11-21 ENCOUNTER — Other Ambulatory Visit: Payer: Self-pay | Admitting: Advanced Practice Midwife

## 2018-11-21 ENCOUNTER — Ambulatory Visit: Payer: Self-pay | Admitting: Advanced Practice Midwife

## 2018-11-21 VITALS — BP 108/72 | Temp 97.5°F | Wt 163.6 lb

## 2018-11-21 DIAGNOSIS — Z23 Encounter for immunization: Secondary | ICD-10-CM

## 2018-11-21 DIAGNOSIS — O322XX0 Maternal care for transverse and oblique lie, not applicable or unspecified: Secondary | ICD-10-CM

## 2018-11-21 DIAGNOSIS — Z8759 Personal history of other complications of pregnancy, childbirth and the puerperium: Secondary | ICD-10-CM

## 2018-11-21 DIAGNOSIS — O329XX0 Maternal care for malpresentation of fetus, unspecified, not applicable or unspecified: Secondary | ICD-10-CM | POA: Insufficient documentation

## 2018-11-21 DIAGNOSIS — O24410 Gestational diabetes mellitus in pregnancy, diet controlled: Secondary | ICD-10-CM

## 2018-11-21 DIAGNOSIS — O0990 Supervision of high risk pregnancy, unspecified, unspecified trimester: Secondary | ICD-10-CM

## 2018-11-21 LAB — URINALYSIS
Bilirubin, UA: NEGATIVE
Glucose, UA: NEGATIVE
Ketones, UA: NEGATIVE
Nitrite, UA: NEGATIVE
Protein,UA: NEGATIVE
RBC, UA: NEGATIVE
Specific Gravity, UA: 1.025 (ref 1.005–1.030)
Urobilinogen, Ur: 0.2 mg/dL (ref 0.2–1.0)
pH, UA: 6.5 (ref 5.0–7.5)

## 2018-11-21 NOTE — Progress Notes (Signed)
   PRENATAL VISIT NOTE  Subjective:  Tracey Thompson is a 28 y.o. G2P1001 at [redacted]w[redacted]d being seen today for ongoing prenatal care.  She is currently monitored for the following issues for this high-risk pregnancy and has Family history of Down syndrome; Supervision of high risk pregnancy, antepartum; History of postpartum hemorrhage; ASCUS of cervix with negative high risk HPV; Gestational diabetes mellitus (GDM), antepartum; and Abnormal fetal presentation: transverse lie on 11/21/18 u/s on their problem list.  Patient reports no complaints.  Contractions: Not present. Vag. Bleeding: None.  Movement: Present. Denies leaking of fluid/ROM.   The following portions of the patient's history were reviewed and updated as appropriate: allergies, current medications, past family history, past medical history, past social history, past surgical history and problem list. Problem list updated.  Objective:   Vitals:   11/21/18 1504  BP: 108/72  Temp: (!) 97.5 F (36.4 C)  Weight: 163 lb 9.6 oz (74.2 kg)    Fetal Status: Fetal Heart Rate (bpm): 130 Fundal Height: 35 cm Movement: Present  Presentation: Transverse  General:  Alert, oriented and cooperative. Patient is in no acute distress.  Skin: Skin is warm and dry. No rash noted.   Cardiovascular: Normal heart rate noted  Respiratory: Normal respiratory effort, no problems with respiration noted  Abdomen: Soft, gravid, appropriate for gestational age.  Pain/Pressure: Absent     Pelvic: Cervical exam deferred        Extremities: Normal range of motion.  Edema: None  Mental Status: Normal mood and affect. Normal behavior. Normal judgment and thought content.   Assessment and Plan:  Pregnancy: G2P1001 at [redacted]w[redacted]d  1. Diet controlled gestational diabetes mellitus (GDM), antepartum FBS 75-87 (all normal values); 2 hr pp 74-123 (2/19 values abnormal) Breakfast today: 1 glass 2% milk, 1 biscuit with chicken and ketsup Lunch today:   Vegetables (carrots, potato, celery) 1 piece cheese, ketsup, water Dinner last night: vegetables, 2 tortillas, cheese, green beans, 1 egg, water Snack before bed: hot chocolate, 2 slices bread - Urinalysis (Urine Dip)  2. Supervision of high risk pregnancy, antepartum  Reviewed u/s from today--transverse.  Per Dr. Leafy Ro if not vtx at 39 wks then schedule external version with u/s Delivery 37-40 wks depending on blood sugar control  Preterm labor symptoms and general obstetric precautions including but not limited to vaginal bleeding, contractions, leaking of fluid and fetal movement were reviewed in detail with the patient. Please refer to After Visit Summary for other counseling recommendations.  Return in about 1 week (around 11/28/2018).  No future appointments.  Herbie Saxon, CNM

## 2018-11-21 NOTE — Progress Notes (Signed)
U/s on 11/21/18 at 36 1/7 with persistent transverse lie.  Recommend version at 39 wks if not vtx.  Schedule with Dr. Leafy Ro with repeat u/s for confirmation.  Growth and AFI=wnl.  Repeat u/s in 4 wks and delivery between 37-40 wks

## 2018-11-21 NOTE — Addendum Note (Signed)
Addended by: Doy Mince on: 11/21/2018 04:06 PM   Modules accepted: Orders

## 2018-11-21 NOTE — Progress Notes (Addendum)
Influenza shot administered. Urine Dip reviewed, no tx per standing orders. Provider orders completed.

## 2018-11-21 NOTE — Progress Notes (Signed)
Taking PNV. Denies visits to ER since last visit to [facility]. Blood sugar log provided by pt, copy made. Pt still unsure of what BC she would like to use after delivery. Accepts influenza vaccine today.

## 2018-11-26 ENCOUNTER — Telehealth: Payer: Self-pay

## 2018-11-26 NOTE — Telephone Encounter (Signed)
Phone call to patient with the assistance of M. [place]), agency interpreter to inform patient per Blenda Nicely at [facility] that she has been scheduled an appt for 12/08/2018 @ 1:45 with Dr. Leafy Ro to formulate a plan for an external version due to Bree[place] position of baby. Patient states she "already knows about the appt." Hal Morales, RN

## 2018-11-28 ENCOUNTER — Ambulatory Visit: Payer: Self-pay | Admitting: Advanced Practice Midwife

## 2018-11-28 ENCOUNTER — Other Ambulatory Visit: Payer: Self-pay

## 2018-11-28 VITALS — BP 103/68 | Temp 97.6°F | Wt 161.6 lb

## 2018-11-28 DIAGNOSIS — O0990 Supervision of high risk pregnancy, unspecified, unspecified trimester: Secondary | ICD-10-CM

## 2018-11-28 DIAGNOSIS — Z8759 Personal history of other complications of pregnancy, childbirth and the puerperium: Secondary | ICD-10-CM

## 2018-11-28 DIAGNOSIS — Z3009 Encounter for other general counseling and advice on contraception: Secondary | ICD-10-CM

## 2018-11-28 DIAGNOSIS — O24410 Gestational diabetes mellitus in pregnancy, diet controlled: Secondary | ICD-10-CM

## 2018-11-28 LAB — URINALYSIS
Bilirubin, UA: NEGATIVE
Glucose, UA: NEGATIVE
Ketones, UA: NEGATIVE
Nitrite, UA: NEGATIVE
Protein,UA: NEGATIVE
RBC, UA: NEGATIVE
Specific Gravity, UA: 1.03 (ref 1.005–1.030)
Urobilinogen, Ur: 0.2 mg/dL (ref 0.2–1.0)
pH, UA: 6.5 (ref 5.0–7.5)

## 2018-11-28 MED ORDER — MEDROXYPROGESTERONE ACETATE 150 MG/ML IM SUSP: 150.0000 mg | Freq: Once | INTRAMUSCULAR | Status: DC

## 2018-11-28 NOTE — Progress Notes (Signed)
In for visit; denies hospital visits; aware of 12/08/18 [facility] appt.; taking PNV, undecided on BCM; BS log copied; self collection of cultures today Debera Lat, RN

## 2018-11-28 NOTE — Progress Notes (Signed)
   PRENATAL VISIT NOTE  Subjective:  Tracey Thompson is a 28 y.o. G2P1001 at [redacted]w[redacted]d being seen today for ongoing prenatal care.  She is currently monitored for the following issues for this high-risk pregnancy and has Family history of Down syndrome; Supervision of high risk pregnancy, antepartum; History of postpartum hemorrhage; ASCUS of cervix with negative high risk HPV; Gestational diabetes mellitus (GDM), antepartum; and Abnormal fetal presentation: transverse lie on 11/21/18 u/s on their problem list.  Patient reports no complaints.  Contractions: Not present. Vag. Bleeding: None.  Movement: Present. Denies leaking of fluid/ROM.   The following portions of the patient's history were reviewed and updated as appropriate: allergies, current medications, past family history, past medical history, past social history, past surgical history and problem list. Problem list updated.  Objective:   Vitals:   11/28/18 1534  BP: 103/68  Temp: 97.6 F (36.4 C)  Weight: 161 lb 9.6 oz (73.3 kg)    Fetal Status: Fetal Heart Rate (bpm): 140 Fundal Height: 37 cm Movement: Present  Presentation: Transverse  General:  Alert, oriented and cooperative. Patient is in no acute distress.  Skin: Skin is warm and dry. No rash noted.   Cardiovascular: Normal heart rate noted  Respiratory: Normal respiratory effort, no problems with respiration noted  Abdomen: Soft, gravid, appropriate for gestational age.  Pain/Pressure: Absent     Pelvic: Cervical exam deferred        Extremities: Normal range of motion.  Edema: None  Mental Status: Normal mood and affect. Normal behavior. Normal judgment and thought content.   Assessment and Plan:  Pregnancy: G2P1001 at [redacted]w[redacted]d  1. Diet controlled gestational diabetes mellitus (GDM), antepartum FBS 71-85 (all wnl).  2 hr pp 84-121 (2/22 abnormal).  Breakfast: 1 bowl cinnamon toast crunch cereal with 2% milk Lunch:  fettucine alfredo with shrimp,  water Dinner last night: sandwich with ham, cheese, tomato, mayo, mustard, ketsup, water Walking 2x/wk x 30 min.     2. Supervision of high risk pregnancy, antepartum Has u/s at [facility] 11/2//20 for presentation check and to have external version if not vtx. - Chlamydia/GC NAA, Confirmation - GBS Culture     Preterm labor symptoms and general obstetric precautions including but not limited to vaginal bleeding, contractions, leaking of fluid and fetal movement were reviewed in detail with the patient. Please refer to After Visit Summary for other counseling recommendations.  Return in about 1 week (around 12/05/2018).  No future appointments.  Herbie Saxon, CNM

## 2018-12-02 LAB — CHLAMYDIA/GC NAA, CONFIRMATION
Chlamydia trachomatis, NAA: NEGATIVE
Neisseria gonorrhoeae, NAA: NEGATIVE

## 2018-12-02 LAB — CULTURE, BETA STREP (GROUP B ONLY): Strep Gp B Culture: NEGATIVE

## 2018-12-05 ENCOUNTER — Other Ambulatory Visit: Payer: Self-pay

## 2018-12-05 ENCOUNTER — Ambulatory Visit: Payer: Self-pay | Admitting: Family Medicine

## 2018-12-05 DIAGNOSIS — Z8759 Personal history of other complications of pregnancy, childbirth and the puerperium: Secondary | ICD-10-CM

## 2018-12-05 DIAGNOSIS — O0990 Supervision of high risk pregnancy, unspecified, unspecified trimester: Secondary | ICD-10-CM

## 2018-12-05 DIAGNOSIS — O24410 Gestational diabetes mellitus in pregnancy, diet controlled: Secondary | ICD-10-CM

## 2018-12-05 LAB — URINALYSIS
Bilirubin, UA: NEGATIVE
Glucose, UA: NEGATIVE
Ketones, UA: NEGATIVE
Nitrite, UA: NEGATIVE
Protein,UA: NEGATIVE
RBC, UA: NEGATIVE
Specific Gravity, UA: 1.015 (ref 1.005–1.030)
Urobilinogen, Ur: 0.2 mg/dL (ref 0.2–1.0)
pH, UA: 7 (ref 5.0–7.5)

## 2018-12-05 NOTE — Progress Notes (Signed)
In for visit; taking PNV; denies hospital visits; aware of 12/08/18 [facility] appt Debera Lat, RN

## 2018-12-05 NOTE — Progress Notes (Signed)
  PRENATAL VISIT NOTE  Subjective:  Tracey Thompson is a 28 y.o. G2P1001 at [redacted]w[redacted]d being seen today for ongoing prenatal care.  She is currently monitored for the following issues for this low-risk pregnancy and has Family history of Down syndrome; Supervision of high risk pregnancy, antepartum; History of postpartum hemorrhage; ASCUS of cervix with negative high risk HPV; Gestational diabetes mellitus (GDM), antepartum; and Abnormal fetal presentation: transverse lie on 11/21/18 u/s on their problem list.  Patient reports no complaints.  Contractions: Not present. Vag. Bleeding: None.  Movement: Present. Denies leaking of fluid/ROM.   The following portions of the patient's history were reviewed and updated as appropriate: allergies, current medications, past family history, past medical history, past social history, past surgical history and problem list. Problem list updated.  Objective:   Vitals:   12/05/18 1102  BP: 108/71  Temp: (!) 97.1 F (36.2 C)  Weight: 160 lb 3.2 oz (72.7 kg)    Fetal Status: Fetal Heart Rate (bpm): 140 Fundal Height: 37 cm Movement: Present  Presentation: Vertex  General:  Alert, oriented and cooperative. Patient is in no acute distress.  Skin: Skin is warm and dry. No rash noted.   Cardiovascular: Normal heart rate noted  Respiratory: Normal respiratory effort, no problems with respiration noted  Abdomen: Soft, gravid, appropriate for gestational age.  Pain/Pressure: Absent     Pelvic: Cervical exam deferred        Extremities: Normal range of motion.  Edema: None  Mental Status: Normal mood and affect. Normal behavior. Normal judgment and thought content.   Assessment and Plan:  Pregnancy: G2P1001 at [redacted]w[redacted]d  1. Diet controlled gestational diabetes mellitus (GDM), antepartum Fasting BS 67-85 2h PP 90-130 ( only 3 of 18 above 120) Reinforced dietary changes IOL at EDD if remains diet controlled.   2. Supervision of high risk pregnancy,  antepartum Discussed contraception today- reviewed cost of BTL and options for LARC  3. History of postpartum hemorrhage LD aware for planning  4. Malpresentation Transverse at last [place] but lepolds feels Vtx today -- await official US at [facility]  Preterm labor symptoms and general obstetric precautions including but not limited to vaginal bleeding, contractions, leaking of fluid and fetal movement were reviewed in detail with the patient. Please refer to After Visit Summary for other counseling recommendations.  Return in about 1 week (around 12/12/2018) for Routine prenatal care, in person.  Future Appointments  Date Time Provider [place]  12/12/2018  3:00 PM AC-MH PROVIDER AC-MAT None    Caren Macadam, MD

## 2018-12-12 ENCOUNTER — Ambulatory Visit: Payer: Self-pay

## 2018-12-12 ENCOUNTER — Other Ambulatory Visit: Payer: Self-pay

## 2018-12-12 ENCOUNTER — Ambulatory Visit: Payer: Medicaid Other | Admitting: Family Medicine

## 2018-12-12 DIAGNOSIS — Z8759 Personal history of other complications of pregnancy, childbirth and the puerperium: Secondary | ICD-10-CM

## 2018-12-12 DIAGNOSIS — O0990 Supervision of high risk pregnancy, unspecified, unspecified trimester: Secondary | ICD-10-CM

## 2018-12-12 DIAGNOSIS — O24410 Gestational diabetes mellitus in pregnancy, diet controlled: Secondary | ICD-10-CM

## 2018-12-12 LAB — URINALYSIS
Bilirubin, UA: POSITIVE — AB
Glucose, UA: NEGATIVE
Nitrite, UA: NEGATIVE
Protein,UA: NEGATIVE
RBC, UA: NEGATIVE
Specific Gravity, UA: 1.025 (ref 1.005–1.030)
Urobilinogen, Ur: 1 mg/dL (ref 0.2–1.0)
pH, UA: 6.5 (ref 5.0–7.5)

## 2018-12-12 MED ORDER — PRENATAL VITAMINS 28-0.8 MG PO TABS: 1.0000 | ORAL_TABLET | Freq: Every day | ORAL | 0 refills | Status: DC

## 2018-12-12 NOTE — Progress Notes (Signed)
Patient here for MH RV at 38 6/7. Urine dip today, BS log copied.Jenetta Downer, RN

## 2018-12-12 NOTE — Progress Notes (Signed)
PRENATAL VISIT NOTE  Subjective:  Tracey Thompson is a 28 y.o. G2P1001 at [redacted]w[redacted]d being seen today for ongoing prenatal care.  She is currently monitored for the following issues for this high-risk pregnancy and has Family history of Down syndrome; Supervision of high risk pregnancy, antepartum; History of postpartum hemorrhage; ASCUS of cervix with negative high risk HPV; Gestational diabetes mellitus (GDM), antepartum; and Abnormal fetal presentation: transverse lie on 11/21/18 u/s on their problem list.  Patient reports no complaints.  Contractions: Not present. Vag. Bleeding: None.  Movement: Present. Denies leaking of fluid/ROM.   The following portions of the patient's history were reviewed and updated as appropriate: allergies, current medications, past family history, past medical history, past social history, past surgical history and problem list. Problem list updated.  Objective:   Vitals:   12/12/18 1459  BP: 110/70  Temp: (!) 97.2 F (36.2 C)  Weight: 161 lb (73 kg)    Fetal Status: Fetal Heart Rate (bpm): 140 Fundal Height: 37 cm Movement: Present  Presentation: Vertex  General:  Alert, oriented and cooperative. Patient is in no acute distress.  Skin: Skin is warm and dry. No rash noted.   Cardiovascular: Normal heart rate noted  Respiratory: Normal respiratory effort, no problems with respiration noted  Abdomen: Soft, gravid, appropriate for gestational age.  Pain/Pressure: Absent     Pelvic: Cervical exam performed Dilation: 1 Effacement (%): 50 Station: -3  Extremities: Normal range of motion.  Edema: None  Mental Status: Normal mood and affect. Normal behavior. Normal judgment and thought content.   Assessment and Plan:  Pregnancy: G2P1001 at [redacted]w[redacted]d  1. Diet controlled gestational diabetes mellitus (GDM), antepartum [place]- all wnl and 70-80s, PP are 110s with only 2 of 21 > 120). Excellent control - IOL form filled out today for IOL on EDD if  needed - Urinalysis (Urine Dip)  2. Supervision of high risk pregnancy, antepartum Up to date Reviewed plan for IOL at EDD - Urinalysis (Urine Dip)  3. History of postpartum hemorrhage LD aware    Preterm labor symptoms and general obstetric precautions including but not limited to vaginal bleeding, contractions, leaking of fluid and fetal movement were reviewed in detail with the patient. Please refer to After Visit Summary for other counseling recommendations.  Return in about 1 week (around 12/19/2018) for Routine prenatal care, in person.  Future Appointments  Date Time Provider [place]  12/19/2018  3:00 PM AC-MH PROVIDER AC-MAT None    Caren Macadam, MD

## 2018-12-15 ENCOUNTER — Telehealth: Payer: Self-pay | Admitting: Family Medicine

## 2018-12-15 ENCOUNTER — Inpatient Hospital Stay: Payer: Medicaid Other | Admitting: Registered Nurse

## 2018-12-15 ENCOUNTER — Other Ambulatory Visit: Payer: Self-pay

## 2018-12-15 ENCOUNTER — Inpatient Hospital Stay
Admission: EM | Admit: 2018-12-15 | Discharge: 2018-12-17 | DRG: 807 | Disposition: A | Discharge status: Home / Self Care | Payer: Medicaid Other | Attending: Obstetrics and Gynecology | Admitting: Obstetrics and Gynecology

## 2018-12-15 DIAGNOSIS — Z8759 Personal history of other complications of pregnancy, childbirth and the puerperium: Secondary | ICD-10-CM

## 2018-12-15 DIAGNOSIS — O0990 Supervision of high risk pregnancy, unspecified, unspecified trimester: Secondary | ICD-10-CM

## 2018-12-15 DIAGNOSIS — Z20828 Contact with and (suspected) exposure to other viral communicable diseases: Secondary | ICD-10-CM | POA: Diagnosis present

## 2018-12-15 DIAGNOSIS — Z3A39 39 weeks gestation of pregnancy: Secondary | ICD-10-CM | POA: Diagnosis not present

## 2018-12-15 DIAGNOSIS — O24410 Gestational diabetes mellitus in pregnancy, diet controlled: Secondary | ICD-10-CM

## 2018-12-15 DIAGNOSIS — O26893 Other specified pregnancy related conditions, third trimester: Secondary | ICD-10-CM | POA: Diagnosis present

## 2018-12-15 DIAGNOSIS — O24420 Gestational diabetes mellitus in childbirth, diet controlled: Secondary | ICD-10-CM | POA: Diagnosis present

## 2018-12-15 DIAGNOSIS — Z3490 Encounter for supervision of normal pregnancy, unspecified, unspecified trimester: Secondary | ICD-10-CM

## 2018-12-15 LAB — SARS CORONAVIRUS 2 (TAT 6-24 HRS): SARS Coronavirus 2: NEGATIVE

## 2018-12-15 LAB — CBC
HCT: 43.7 % (ref 36.0–46.0)
Hemoglobin: 14.5 g/dL (ref 12.0–15.0)
MCH: 27.4 pg (ref 26.0–34.0)
MCHC: 33.2 g/dL (ref 30.0–36.0)
MCV: 82.5 fL (ref 80.0–100.0)
Platelets: 233 10*3/uL (ref 150–400)
RBC: 5.3 MIL/uL — ABNORMAL HIGH (ref 3.87–5.11)
RDW: 14 % (ref 11.5–15.5)
WBC: 8.6 10*3/uL (ref 4.0–10.5)
nRBC: 0 % (ref 0.0–0.2)

## 2018-12-15 LAB — TYPE AND SCREEN
ABO/RH(D): O POS
Antibody Screen: NEGATIVE

## 2018-12-15 MED ORDER — SOD CITRATE-CITRIC ACID 500-334 MG/5ML PO SOLN: 30.0000 mL | ORAL | Status: DC | PRN

## 2018-12-15 MED ORDER — LIDOCAINE HCL (PF) 1 % IJ SOLN
30.0000 mL | INTRAMUSCULAR | Status: DC | PRN
  Filled 2018-12-15: qty 30

## 2018-12-15 MED ORDER — ONDANSETRON HCL 4 MG PO TABS: 4.0000 mg | ORAL_TABLET | ORAL | Status: DC | PRN

## 2018-12-15 MED ORDER — ACETAMINOPHEN 325 MG PO TABS: 650.0000 mg | ORAL_TABLET | ORAL | Status: DC | PRN

## 2018-12-15 MED ORDER — BUTORPHANOL TARTRATE 1 MG/ML IJ SOLN
INTRAMUSCULAR | Status: AC
  Filled 2018-12-15: qty 1

## 2018-12-15 MED ORDER — LIDOCAINE HCL (PF) 1 % IJ SOLN
INTRAMUSCULAR | Status: DC | PRN
  Administered 2018-12-15: 3 mL via SUBCUTANEOUS

## 2018-12-15 MED ORDER — PRENATAL MULTIVITAMIN CH
1.0000 | ORAL_TABLET | Freq: Every day | ORAL | Status: DC
  Administered 2018-12-16 – 2018-12-17 (×2): 1 via ORAL
  Filled 2018-12-15 (×2): qty 1

## 2018-12-15 MED ORDER — SIMETHICONE 80 MG PO CHEW: 80.0000 mg | CHEWABLE_TABLET | ORAL | Status: DC | PRN

## 2018-12-15 MED ORDER — ONDANSETRON HCL 4 MG/2ML IJ SOLN: 4.0000 mg | INTRAMUSCULAR | Status: DC | PRN

## 2018-12-15 MED ORDER — DIPHENHYDRAMINE HCL 25 MG PO CAPS: 25.0000 mg | ORAL_CAPSULE | Freq: Four times a day (QID) | ORAL | Status: DC | PRN

## 2018-12-15 MED ORDER — FENTANYL 2.5 MCG/ML W/ROPIVACAINE 0.15% IN NS 100 ML EPIDURAL (ARMC)
EPIDURAL | Status: AC
  Filled 2018-12-15: qty 100

## 2018-12-15 MED ORDER — LACTATED RINGERS IV SOLN
INTRAVENOUS | Status: DC
  Administered 2018-12-15: 13:00:00 via INTRAVENOUS

## 2018-12-15 MED ORDER — BENZOCAINE-MENTHOL 20-0.5 % EX AERO
1.0000 "application " | INHALATION_SPRAY | CUTANEOUS | Status: DC | PRN
  Filled 2018-12-15: qty 56

## 2018-12-15 MED ORDER — OXYTOCIN 40 UNITS IN NORMAL SALINE INFUSION - SIMPLE MED
2.5000 [IU]/h | INTRAVENOUS | Status: DC
  Filled 2018-12-15: qty 1000

## 2018-12-15 MED ORDER — COCONUT OIL OIL: 1.0000 "application " | TOPICAL_OIL | Status: DC | PRN

## 2018-12-15 MED ORDER — MISOPROSTOL 200 MCG PO TABS
ORAL_TABLET | ORAL | Status: AC
  Filled 2018-12-15: qty 4

## 2018-12-15 MED ORDER — FLEET ENEMA 7-19 GM/118ML RE ENEM: 1.0000 | ENEMA | Freq: Every day | RECTAL | Status: DC | PRN

## 2018-12-15 MED ORDER — SODIUM CHLORIDE 0.9% FLUSH
3.0000 mL | Freq: Two times a day (BID) | INTRAVENOUS | Status: DC
  Administered 2018-12-16 (×2): 3 mL via INTRAVENOUS

## 2018-12-15 MED ORDER — LIDOCAINE-EPINEPHRINE (PF) 1.5 %-1:200000 IJ SOLN
INTRAMUSCULAR | Status: DC | PRN
  Administered 2018-12-15: 3 mL via EPIDURAL

## 2018-12-15 MED ORDER — DIBUCAINE (PERIANAL) 1 % EX OINT: 1.0000 "application " | TOPICAL_OINTMENT | CUTANEOUS | Status: DC | PRN

## 2018-12-15 MED ORDER — OXYCODONE-ACETAMINOPHEN 5-325 MG PO TABS: 1.0000 | ORAL_TABLET | ORAL | Status: DC | PRN

## 2018-12-15 MED ORDER — SENNOSIDES-DOCUSATE SODIUM 8.6-50 MG PO TABS
2.0000 | ORAL_TABLET | ORAL | Status: DC
  Administered 2018-12-17: 13:00:00 2 via ORAL
  Filled 2018-12-15 (×2): qty 2

## 2018-12-15 MED ORDER — FENTANYL 2.5 MCG/ML W/ROPIVACAINE 0.15% IN NS 100 ML EPIDURAL (ARMC)
EPIDURAL | Status: DC | PRN
  Administered 2018-12-15: 12 mL/h via EPIDURAL

## 2018-12-15 MED ORDER — OXYTOCIN BOLUS FROM INFUSION
500.0000 mL | Freq: Once | INTRAVENOUS | Status: AC
  Administered 2018-12-15: 500 mL via INTRAVENOUS

## 2018-12-15 MED ORDER — SODIUM CHLORIDE 0.9% FLUSH: 3.0000 mL | INTRAVENOUS | Status: DC | PRN

## 2018-12-15 MED ORDER — BUPIVACAINE HCL (PF) 0.25 % IJ SOLN
INTRAMUSCULAR | Status: DC | PRN
  Administered 2018-12-15 (×2): 4 mL via EPIDURAL

## 2018-12-15 MED ORDER — BUTORPHANOL TARTRATE 1 MG/ML IJ SOLN
1.0000 mg | INTRAMUSCULAR | Status: DC
  Administered 2018-12-15 (×2): 1 mg via INTRAVENOUS
  Filled 2018-12-15: qty 1

## 2018-12-15 MED ORDER — OXYCODONE HCL 5 MG PO TABS: 5.0000 mg | ORAL_TABLET | ORAL | Status: DC | PRN

## 2018-12-15 MED ORDER — MEASLES, MUMPS & RUBELLA VAC IJ SOLR
0.5000 mL | Freq: Once | INTRAMUSCULAR | Status: DC
  Filled 2018-12-15: qty 0.5

## 2018-12-15 MED ORDER — LACTATED RINGERS IV SOLN: 500.0000 mL | INTRAVENOUS | Status: DC | PRN

## 2018-12-15 MED ORDER — SODIUM CHLORIDE 0.9 % IV SOLN: 250.0000 mL | INTRAVENOUS | Status: DC | PRN

## 2018-12-15 MED ORDER — ZOLPIDEM TARTRATE 5 MG PO TABS: 5.0000 mg | ORAL_TABLET | Freq: Every evening | ORAL | Status: DC | PRN

## 2018-12-15 MED ORDER — WITCH HAZEL-GLYCERIN EX PADS: 1.0000 "application " | MEDICATED_PAD | CUTANEOUS | Status: DC | PRN

## 2018-12-15 MED ORDER — OXYCODONE-ACETAMINOPHEN 5-325 MG PO TABS: 2.0000 | ORAL_TABLET | ORAL | Status: DC | PRN

## 2018-12-15 MED ORDER — IBUPROFEN 600 MG PO TABS
600.0000 mg | ORAL_TABLET | Freq: Four times a day (QID) | ORAL | Status: DC
  Administered 2018-12-16: 600 mg via ORAL
  Filled 2018-12-15: qty 1

## 2018-12-15 MED ORDER — TETANUS-DIPHTH-ACELL PERTUSSIS 5-2.5-18.5 LF-MCG/0.5 IM SUSP: 0.5000 mL | Freq: Once | INTRAMUSCULAR | Status: DC

## 2018-12-15 MED ORDER — ONDANSETRON HCL 4 MG/2ML IJ SOLN: 4.0000 mg | Freq: Four times a day (QID) | INTRAMUSCULAR | Status: DC | PRN

## 2018-12-15 MED ORDER — BISACODYL 10 MG RE SUPP: 10.0000 mg | Freq: Every day | RECTAL | Status: DC | PRN

## 2018-12-15 NOTE — Telephone Encounter (Signed)
Returned phone call with M. [place]). No answer, LMTC. Hal Morales, RN

## 2018-12-15 NOTE — OB Triage Note (Signed)
Pt presents c/o ctx that started around 2 am. Pt feels her ctx in her lower abdomen and rates 4-5/10 on pain scale. Pt states she has had some bloody mucus discharge. Denies recent intercourse. Denies LOF. Reports positive fetal movement. Vitals WNL. Will continue to monitor.

## 2018-12-15 NOTE — H&P (Signed)
OB ADMISSION/ HISTORY & PHYSICAL:  Admission Date: 12/15/2018 11:26 AM  Admit Diagnosis: Active labor  Tracey Thompson is a 28 y.o. G2P1001 presenting for contractions x12 hrs, no SROM, good fetal movememnt.  Prenatal History: G2P1001   EDC : 12/20/2018, by Last Menstrual Period  Prenatal care at [facility] Prenatal course complicated by  - -Family hx of down syndrome -Hx of postpartum hemorrhage -ASCUS with neg HPV -Gestational diabetes, diet controlled;   -Growth US 11/21/18 wnl:  [facility] Ob efw gdm trv present Head to lt  Fhr=144 bpm Ant fundal plac Afi=156 mm @ 56% Efw=2849 g @ 52%   - Breech presentation until 38 weeks, now cephalic  Medical / Surgical History :  Past medical history:  Past Medical History:  Diagnosis Date  . Gestational diabetes      Past surgical history: History reviewed. No pertinent surgical history.  Family History:  Family History  Problem Relation Age of Onset  . Hypertension Mother   . Down syndrome Son   . Heart murmur Son      Social History:  reports that she has never smoked. She has never used smokeless tobacco. She reports that she does not drink alcohol or use drugs.   Allergies: Patient has no known allergies.    Current Medications at time of admission:  Prior to Admission medications   Medication Sig Start Date End Date Taking? Authorizing Provider  Prenatal Vit-Fe Fumarate-FA (PRENATAL MULTIVITAMIN) TABS tablet Take 1 tablet by mouth daily at 12 noon.   Yes [provider]  blood glucose meter kit and supplies KIT Dispense based on patient and insurance preference. Use up to four times daily as directed. (FOR ICD-9 250.00, 250.01). Patient not taking: Reported on 10/16/2018 10/06/18   Sciora, Elizabeth A, CNM  Prenatal Vit-Fe Fumarate-FA (PRENATAL VITAMINS) 28-0.8 MG TABS Take 1 tablet by mouth daily. 12/12/18 03/22/19  Newton, Kimberly Niles, MD     Review of Systems: Active FM onset of ctx @ 0200  currently every 3-4 minutes LOF  / SROM: no bloody show yes   Physical Exam:  VS: Blood pressure 118/81, pulse 91, temperature 98.1 F (36.7 C), temperature source Oral, resp. rate 16, height 5' 3" (1.6 m), weight 72.6 kg, last menstrual period 03/15/2018, SpO2 99 %.  General: alert and oriented, appears NAD Heart: RRR Lungs: Clear lung fields Abdomen: Gravid, soft and non-tender, non-distended Extremities: trace edema  FHT: 135, moderate variability, +accels, no decels TOCO: SVE:  Dilation: 5.5 / Effacement (%): 90 / Station: -1    Cephalic by leopolds and sutures  Prenatal Labs: Blood type/Rh --/--/PENDING (11/09 1251)  Antibody screen neg  Rubella Immune  Varicella Immune  RPR NR  HBsAg Neg  HIV NR  GC neg  Chlamydia neg  Genetic screening negative  1 hour GTT 202  3 hour GTT 190, 173. 140, 88  GBS neg   No results found.  Assessment: [redacted]w[redacted]d weeks gestation 1 stage of labor FHR category 1   Plan:   Admit for active labor Labs pending Epidural when desired Continuous fetal monitoring   1. Fetal Well being  - Fetal Tracing: Reassuring Cat I - Ultrasound:  reviewed, as above - Group B Streptococcus: neg - Presentation: vtx confirmed by Leopolds and sutures   2. Routine OB: - Prenatal labs reviewed, as above - Rh O pos  3. Post Partum Planning: - Infant feeding: breast - Contraception: unknown   

## 2018-12-15 NOTE — Anesthesia Procedure Notes (Signed)
Epidural Patient location during procedure: OB Start time: 12/15/2018 3:14 PM End time: 12/15/2018 3:20 PM  Staffing Anesthesiologist: Molli Barrows, MD Resident/CRNA: Hedda Slade, CRNA Performed: resident/CRNA   Preanesthetic Checklist Completed: patient identified, site marked, surgical consent, pre-op evaluation, timeout performed, IV checked, risks and benefits discussed and monitors and equipment checked  Epidural Patient position: sitting Prep: ChloraPrep Patient monitoring: heart rate, continuous pulse ox and blood pressure Approach: midline Location: L3-L4 Injection technique: LOR air  Needle:  Needle type: Tuohy  Needle gauge: 17 G Needle length: 9 cm and 9 Needle insertion depth: 5 cm Catheter type: closed end flexible Catheter size: 19 Gauge Catheter at skin depth: 11 cm Test dose: negative and 1.5% lidocaine with Epi 1:200 K  Assessment Events: blood not aspirated, injection not painful, no injection resistance, negative IV test and no paresthesia  Additional Notes 1 attempt Pt. Evaluated and documentation done after procedure finished. Patient identified. Risks/Benefits/Options discussed with patient including but not limited to bleeding, infection, nerve damage, paralysis, failed block, incomplete pain control, headache, blood pressure changes, nausea, vomiting, reactions to medication both or allergic, itching and postpartum back pain. Confirmed with bedside nurse the patient's most recent platelet count. Confirmed with patient that they are not currently taking any anticoagulation, have any bleeding history or any family history of bleeding disorders. Patient expressed understanding and wished to proceed. All questions were answered. Sterile technique was used throughout the entire procedure. Please see nursing notes for vital signs. Test dose was given through epidural catheter and negative prior to continuing to dose epidural or start infusion. Warning signs of  high block given to the patient including shortness of breath, tingling/numbness in hands, complete motor block, or any concerning symptoms with instructions to call for help. Patient was given instructions on fall risk and not to get out of bed. All questions and concerns addressed with instructions to call with any issues or inadequate analgesia.   Patient tolerated the insertion well without immediate complications.Reason for block:procedure for pain

## 2018-12-15 NOTE — Anesthesia Preprocedure Evaluation (Signed)
Anesthesia Evaluation  Patient identified by MRN, date of birth, ID band Patient awake    Reviewed: Allergy & Precautions, H&P , NPO status , Patient's Chart, lab work & pertinent test results  Airway Mallampati: III  TM Distance: >3 FB Neck ROM: full    Dental no notable dental hx.    Pulmonary    Pulmonary exam normal        Cardiovascular Normal cardiovascular exam     Neuro/Psych negative neurological ROS  negative psych ROS   GI/Hepatic negative GI ROS, Neg liver ROS,   Endo/Other  diabetes (gestational. diet controlled)  Renal/GU negative Renal ROS  negative genitourinary   Musculoskeletal   Abdominal   Peds  Hematology negative hematology ROS (+)   Anesthesia Other Findings   Reproductive/Obstetrics (+) Pregnancy                             Anesthesia Physical Anesthesia Plan  ASA: II  Anesthesia Plan: Epidural   Post-op Pain Management:    Induction:   PONV Risk Score and Plan:   Airway Management Planned:   Additional Equipment:   Intra-op Plan:   Post-operative Plan:   Informed Consent: I have reviewed the patients History and Physical, chart, labs and discussed the procedure including the risks, benefits and alternatives for the proposed anesthesia with the patient or authorized representative who has indicated his/her understanding and acceptance.     Dental Advisory Given  Plan Discussed with: Anesthesiologist and CRNA  Anesthesia Plan Comments:         Anesthesia Quick Evaluation

## 2018-12-15 NOTE — Discharge Summary (Signed)
Obstetrical Discharge Summary  Patient Name: Tracey Thompson DOB: [birth year] MRN: [id number]  Date of Admission: 12/15/2018 Date of Discharge: 12/17/2018  Primary OB: [place] and  [facility]   Gestational Age at Delivery: [redacted]w[redacted]d  Antepartum complications:   - -Family hx of down syndrome -Hx of postpartum hemorrhage -ASCUS with neg HPV -Gestational diabetes, diet controlled;   Admitting Diagnosis: active labor Secondary Diagnosis: gDMA1 Patient Active Problem List   Diagnosis Date Noted  . Pregnancy 12/15/2018  . Abnormal fetal presentation: transverse lie on 11/21/18 u/s 11/21/2018  . Gestational diabetes mellitus (GDM), antepartum 10/06/2018  . ASCUS of cervix with negative high risk HPV 08/29/2018  . Supervision of high risk pregnancy, antepartum 08/18/2018  . History of postpartum hemorrhage 08/18/2018  . Family history of Down syndrome     Augmentation: AROM Complications: None Intrapartum complications/course:  222GUG2P1001 at 39+2wks presented in active labor. She was AROM for clear fluid at 14:50, and progressed to fully dilated. She pushed over intact perineum with an epidural for a short 2nd stage and delivered the fetal head, followed, with some effort by the fetal body. No shoulder dystocia. The baby was vigorous and crying, and placed on maternal abdomen. Delayed cord clamping and FOB cut the cord. Placenta delivered spontaneously and intact. Tiny right superior labial tear that was hemostatic and needed no suture repair. Mom and baby doing well. Fundus firm, pitocin running.   Date of Delivery: 12/15/2018 Delivered By: BBenjaman KindlerDelivery Type: spontaneous vaginal delivery Anesthesia: epidural Placenta: Spontaneous Laceration: none Episiotomy: none Newborn Data: Live born female "Tracey Thompson" Birth Weight: 3220g 7lb 1.6oz APGAR: 8, 9  Newborn Delivery   Birth date/time: 12/15/2018 17:47:00 Delivery type: Vaginal, Spontaneous       Discharge Physical Exam:  BP 106/74 (BP Location: Right Arm)   Pulse 80   Temp 98.3 F (36.8 C) (Oral)   Resp 20   Ht '5\' 3"'$  (1.6 m)   Wt 72.6 kg   LMP 03/15/2018   SpO2 100%   Breastfeeding Unknown   BMI 28.34 kg/m   General: NAD CV: RRR Pulm: CTABL, nl effort ABD: s/nd/nt, fundus firm and below the umbilicus Lochia: moderate Incision: n/a DVT Evaluation: LE non-ttp, no evidence of DVT on exam.  Hemoglobin  Date Value Ref Range Status  12/17/2018 12.0 12.0 - 15.0 g/dL Final  06/07/2018 13.3  Final   HCT  Date Value Ref Range Status  12/17/2018 36.8 36.0 - 46.0 % Final    Post partum course: Tracey Thompson a G2P1001 who had a SVD on 12/15/2018;  for further details of this delivery, please refer to the delivery note.  Patient had an uncomplicated postpartum course.  By time of discharge on PPD#2, her pain was controlled on oral pain medications; she had appropriate lochia and was ambulating, voiding without difficulty and tolerating regular diet.  She was deemed stable for discharge to home.    Postpartum Procedures: none Disposition: stable, discharge to home. Baby Feeding: breastmilk Baby Disposition: home with mom  Rh Immune globulin given: n/a Rubella vaccine given: n/a Tdap vaccine given in AP or PP setting: 09/26/2018 Flu vaccine given in AP or PP setting: 06/06/2018  Contraception: considering Nexplanon   Plan:  Tracey Thompson was discharged to home in good condition. Follow-up appointment at [facility] with delivering provider in 6 weeks   Discharge Medications: Allergies as of 12/17/2018   No Known Allergies     Medication  List    STOP taking these medications   blood glucose meter kit and supplies Kit   Prenatal Vitamins 28-0.8 MG Tabs     TAKE these medications   acetaminophen 325 MG tablet Commonly known as: Tylenol Take 2 tablets (650 mg total) by mouth every 4 (four) hours as needed (for pain  scale < 4).   ibuprofen 600 MG tablet Commonly known as: ADVIL Take 1 tablet (600 mg total) by mouth every 6 (six) hours as needed for mild pain or moderate pain.   prenatal multivitamin Tabs tablet Take 1 tablet by mouth daily at 12 noon.       Follow-up Information    Benjaman Kindler, MD. Schedule an appointment as soon as possible for a visit in 6 week(s).   Specialty: Obstetrics and Gynecology Why: For routine postpartum visit Contact information: [address] [place] [address] [phone]           Signed: Lisette Grinder, CNM 12/17/2018 8:57 AM

## 2018-12-15 NOTE — Telephone Encounter (Signed)
Patient returned phone call. Spoke with patient with the assistance of M. [place]), Astronomer. Patient states she "started bleeding, passing mucous and having contractions early this morning." Patient states she is timing contractions and they are regular coming every 3-4 minutes and lasting around 40 seconds. Patient states bleeding is better and is only when she wipes. Patient states she has felt the baby move "only a little this morning." RN counseled patient to go to the hospital for evaluation. Hal Morales, RN

## 2018-12-16 LAB — CBC
HCT: 37.4 % (ref 36.0–46.0)
Hemoglobin: 12.8 g/dL (ref 12.0–15.0)
MCH: 28.2 pg (ref 26.0–34.0)
MCHC: 34.2 g/dL (ref 30.0–36.0)
MCV: 82.4 fL (ref 80.0–100.0)
Platelets: 219 10*3/uL (ref 150–400)
RBC: 4.54 MIL/uL (ref 3.87–5.11)
RDW: 14.1 % (ref 11.5–15.5)
WBC: 21.5 10*3/uL — ABNORMAL HIGH (ref 4.0–10.5)
nRBC: 0 % (ref 0.0–0.2)

## 2018-12-16 LAB — RPR: RPR Ser Ql: NONREACTIVE

## 2018-12-16 MED ORDER — IBUPROFEN 600 MG PO TABS
600.0000 mg | ORAL_TABLET | Freq: Four times a day (QID) | ORAL | Status: DC
  Administered 2018-12-16 – 2018-12-17 (×4): 600 mg via ORAL
  Filled 2018-12-16 (×4): qty 1

## 2018-12-16 NOTE — Progress Notes (Addendum)
Post Partum Day 1 Subjective: no complaints and up ad lib Bleeding minimal at delivery, but postpartum 1st hour 6102ml measured. Pt c/o vaginal/vulvar pain, not just with breastfeeding, though no fundal tenderness  Objective: Blood pressure 104/65, pulse 73, temperature 98.3 F (36.8 C), temperature source Oral, resp. rate 20, height 5\' 3"  (1.6 m), weight 72.6 kg, last menstrual period 03/15/2018, SpO2 98 %, unknown if currently breastfeeding.  Physical Exam:  General: alert, cooperative and appears stated age Lochia: appropriate Uterine Fundus: firm DVT Evaluation: No evidence of DVT seen on physical exam. Negative Homan's sign. No tearing or incision on vulva   Recent Labs    12/15/18 1251 12/16/18 0539  HGB 14.5 12.8  HCT 43.7 37.4  white blood cell count elevated at 21   Assessment/Plan: Plan for discharge tomorrow and Breastfeeding Leukocytosis: afebrile, no fundal tenderness, no evidence of DVT. Will repeat WBC in am, continue vitals   LOS: 1 day   Benjaman Kindler 12/16/2018, 8:26 AM

## 2018-12-16 NOTE — Plan of Care (Signed)
Vs stable; up ad lib; tolerating regular diet; taking motrin for pain control; use interpreter with pt

## 2018-12-16 NOTE — Anesthesia Postprocedure Evaluation (Signed)
Anesthesia Post Note  Patient: Tracey Thompson  Procedure(s) Performed: AN AD [place]  Patient location during evaluation: Mother Baby Anesthesia Type: Epidural Level of consciousness: awake and alert Pain management: pain level controlled Vital Signs Assessment: post-procedure vital signs reviewed and stable Respiratory status: spontaneous breathing, nonlabored ventilation and respiratory function stable Cardiovascular status: stable Postop Assessment: no headache, no backache and epidural receding Anesthetic complications: no     Last Vitals:  Vitals:   12/15/18 2336 12/16/18 0253  BP: (!) 106/54 110/65  Pulse: 77 77  Resp: 18 18  Temp: 36.8 C 37 C  SpO2: 98% 99%    Last Pain:  Vitals:   12/16/18 0253  TempSrc: Oral  PainSc:                  Hedda Slade

## 2018-12-16 NOTE — Lactation Note (Signed)
This note was copied from a baby's chart. Lactation Consultation Note  Patient Name: Tracey Thompson Today's Date: 12/16/2018 Reason for consult: Initial assessment  This is mom's second baby; first attempt with breastfeeding. Mom reports 2.12yr old has down syndrome and was in hospital for many days after delivery. Mom reports baby has been feeding a lot, and breastfeeding is painful. Baby has been given mostly formula feeds, but mom is attempting to put to breast prior to bottles, offering formula when baby appears unsatisfied. LC and [facility] intern assisted in repositioning baby at the breast, and working to flange top/bottom lips. Mom reported improved pain level with changes in position and lips, however infant fell asleep shortly after repositioning. Mom last fed formula of 65mL, 1 hour prior to [facility] assistance. LC and [place] intern gave information for breastfeeding basics, newborn stomach size, feeding frequency, early hunger cues, importance of offering breast at each feeding before giving bottle, the impact that formula may have on establishing a milk supply.  [facility] name and number written on whiteboard, mom encouraged to call out with any questions/concerns, or assistance with breastfeeding.  Maternal Data Formula Feeding for Exclusion: No Does the patient have breastfeeding experience prior to this delivery?: No(did not BF with first baby'; down syndrome)  Feeding Feeding Type: Bottle Fed - Formula Nipple Type: Slow - flow  LATCH Score                   Interventions Interventions: Breast feeding basics reviewed;Assisted with latch;Adjust position;Support pillows  Lactation Tools Discussed/Used     Consult Status Consult Status: Follow-up Date: 12/16/18 Follow-up type: In-patient    Lavonia Drafts 12/16/2018, 12:50 PM

## 2018-12-17 LAB — CBC
HCT: 36.8 % (ref 36.0–46.0)
Hemoglobin: 12 g/dL (ref 12.0–15.0)
MCH: 27.5 pg (ref 26.0–34.0)
MCHC: 32.6 g/dL (ref 30.0–36.0)
MCV: 84.2 fL (ref 80.0–100.0)
Platelets: 215 10*3/uL (ref 150–400)
RBC: 4.37 MIL/uL (ref 3.87–5.11)
RDW: 14.3 % (ref 11.5–15.5)
WBC: 14.6 10*3/uL — ABNORMAL HIGH (ref 4.0–10.5)
nRBC: 0 % (ref 0.0–0.2)

## 2018-12-17 MED ORDER — IBUPROFEN 600 MG PO TABS: 600.0000 mg | ORAL_TABLET | Freq: Four times a day (QID) | ORAL | 0 refills | Status: AC | PRN

## 2018-12-17 MED ORDER — IBUPROFEN 600 MG PO TABS
600.0000 mg | ORAL_TABLET | Freq: Four times a day (QID) | ORAL | Status: DC
  Administered 2018-12-17: 600 mg via ORAL
  Filled 2018-12-17: qty 1

## 2018-12-17 MED ORDER — ACETAMINOPHEN 325 MG PO TABS: 650.0000 mg | ORAL_TABLET | ORAL | 0 refills | Status: AC | PRN

## 2018-12-17 NOTE — Lactation Note (Signed)
This note was copied from a baby's chart. Lactation Consultation Note  Patient Name: Girl Iris Ameri C[place]oon Today's Date: 12/17/2018   [facility] intern visited mom to discuss breastfeeding using an interpreter.  Mom had been feeding baby formula for the last couple of feeds without bringing baby to breast.  The last breastfeeding attempt was made at 9:00am.  Mom was encouraged to attempt breastfeeding everytime she noticed a hunger cue before offering a bottle.  It was explained that milk production works in a supply and demand model and feeding baby away from the breast, decreases breast stimulation, thus decreasing her supply.  She was encouraged to offer the breast while in attendance with an [facility] intern.  Baby self-latched beautifully.  [facility] intern identified baby's flanged lips and mouthful of breast, identifying that that was an ideal, deep latch.  Mom was told that clusterfeeding may occur at night, and to rest before then.  Belly balls were used to demonstrate baby's growing belly as a way to show that her supply isn't large because baby's belly isn't large. Both increase and grow to accommodate each other.  Mom was told that her milk may become more plentiful between days 2-5 and to bring baby to the breast often to provide relief.  Mom was content with baby's latch, though baby did not stay on for longer than 5 min, sleeping.  She feels more confident with latching baby next time and hopes to exclusively breastfeed once she returns home.   Interventions    Lactation Tools Discussed/Used     Consult Status      Lavonia Drafts 12/17/2018, 5:10 PM

## 2018-12-17 NOTE — Discharge Summary (Signed)
Patient discharged home, discharge instructions reviewed with interpreter and given, patient states understanding. Patient left floor in stable condition via wheelchair with baby by RN, denies any other needs at this time. Patient follow up scheduled.

## 2018-12-17 NOTE — Plan of Care (Signed)
Vs stable; up ad lib; tolerating regular diet; taking motrin for pain control; use interpreter for patient

## 2018-12-19 ENCOUNTER — Ambulatory Visit: Payer: Self-pay

## 2019-03-05 ENCOUNTER — Telehealth: Payer: Self-pay

## 2019-03-05 NOTE — Telephone Encounter (Signed)
Attempted to call re: PP appt.-no answer & voicemail full Sharlette Dense, RN

## 2020-10-25 IMAGING — US US MFM OB COMP LESS THAN 14 WEEKS
1 series · 13 of 28 positions shown · non-contrast
Comparison: none

PATIENT INFO:

             MONGANGA
PERFORMED BY:
                   Sonographer                              JEANPI
SERVICE(S) PROVIDED:
  US MFM OB COMP LESS THAN 14 WEEKS                    76801.4
 ----------------------------------------------------------------------
INDICATIONS:
  12 weeks gestation of pregnancy
FETAL EVALUATION:
 Num Of Fetuses:         1
 Fetal Heart Rate(bpm):  160
 Cardiac Activity:       Present
 Presentation:           Variable
 Placenta:               Anterior
BIOMETRY:
 CRL:      70.7  mm     G. Age:  13w 2d                  EDD:   12/16/18
GESTATIONAL AGE:
 LMP:           12w 5d        Date:  03/15/18                 EDD:   12/20/18
 Best:          12w 5d     Det. By:  LMP  (03/15/18)          EDD:   12/20/18
ANATOMY:
 Choroid Plexus:        Within normal limits   Upper Extremities:      Visualized
                        for gestational age
 Stomach:               Seen                   Lower Extremities:      Visualized
 Abdominal Wall:        Within normal limits

[Series 1: us mfm ob comp less than 14 weeks · 0.25mm/px · 13 of 44 slices shown]
[im 2/44]
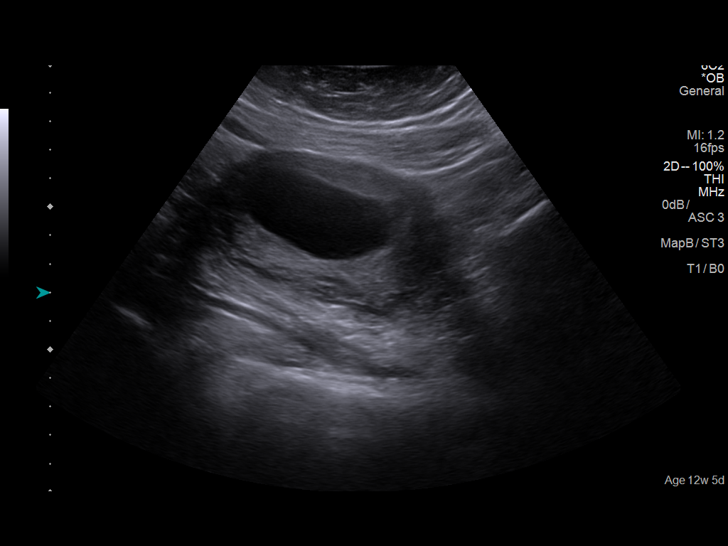
[im 5/44]
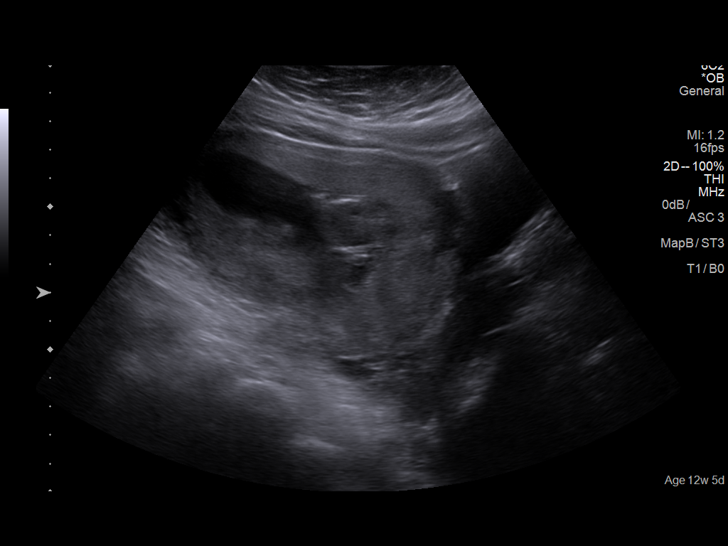
[im 8/44]
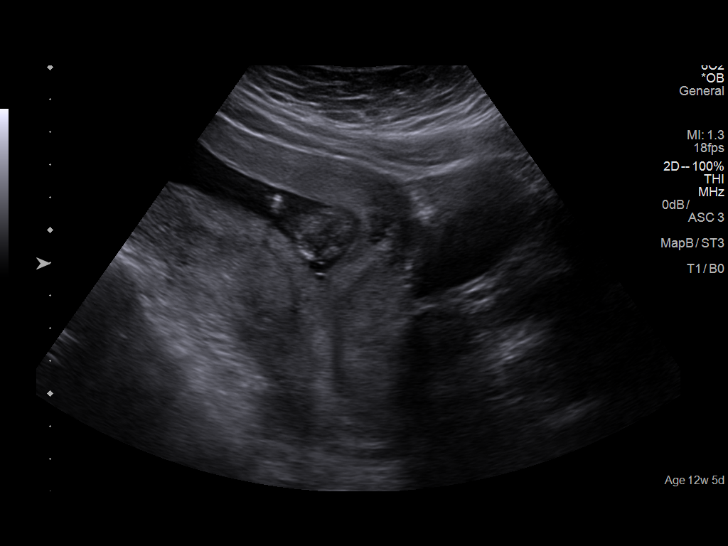
[im 12/44]
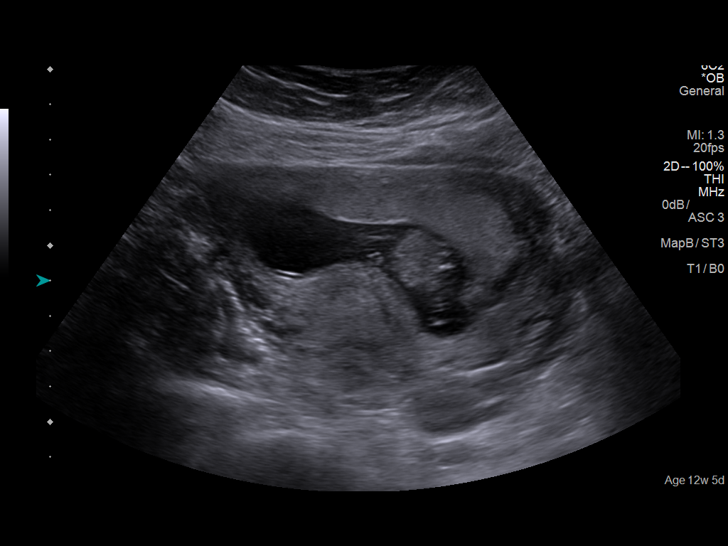
[im 15/44]
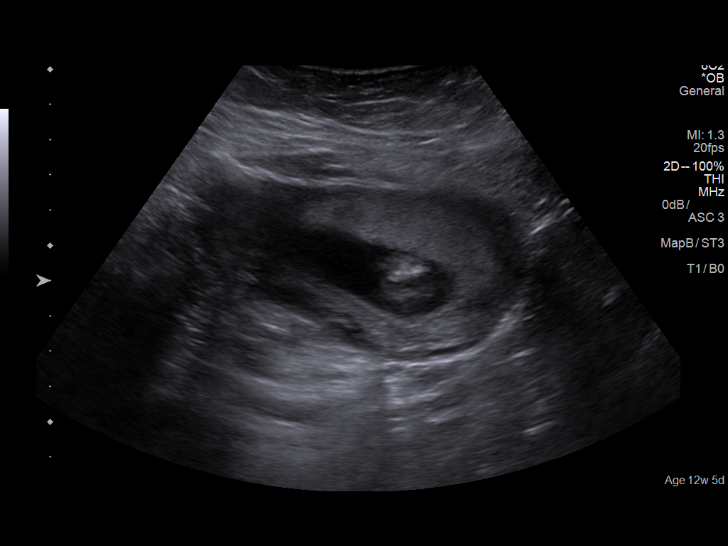
[im 18/44]
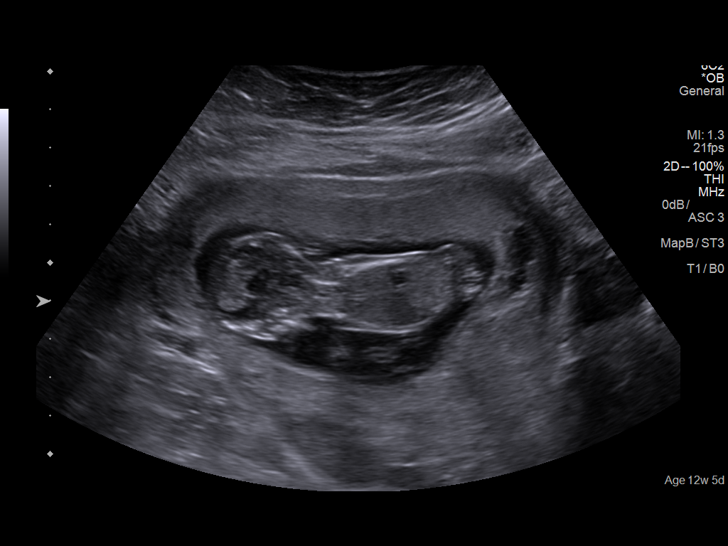
[im 23/44]
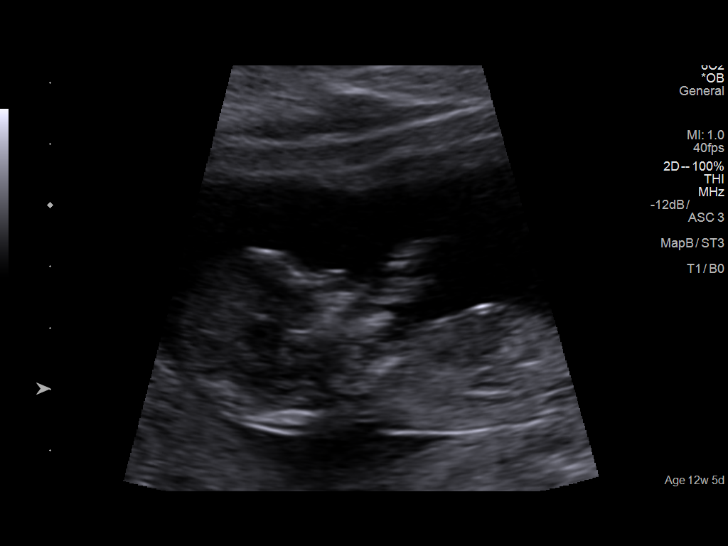
[im 26/44]
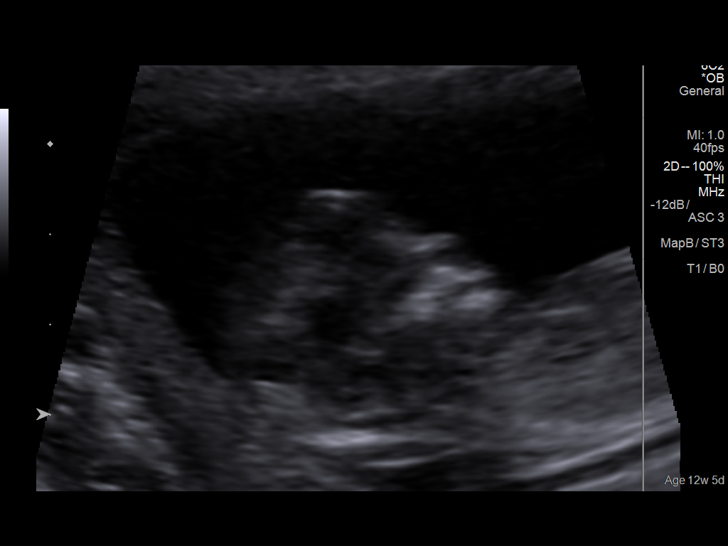
[im 29/44]
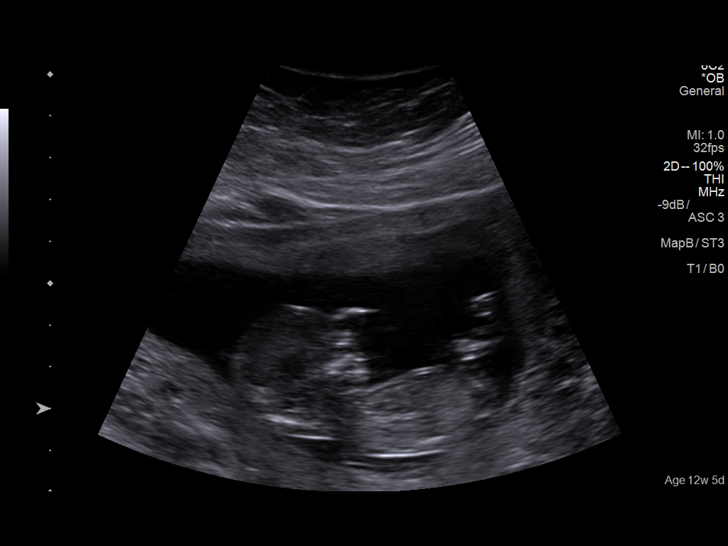
[im 32/44]
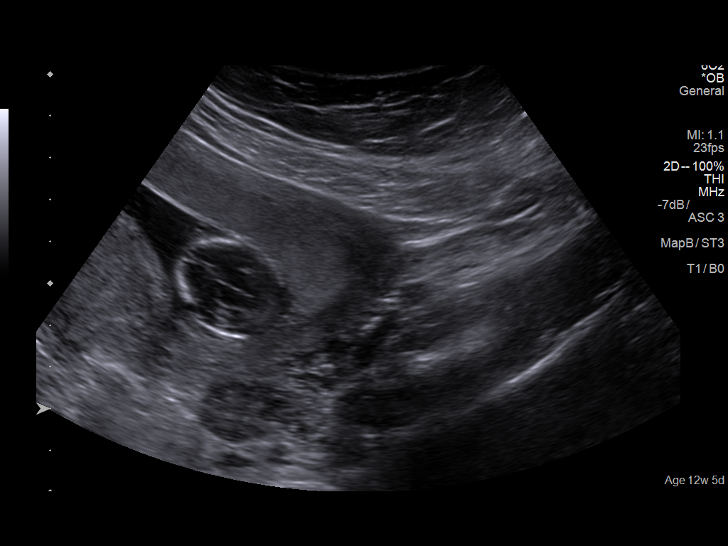
[im 36/44]
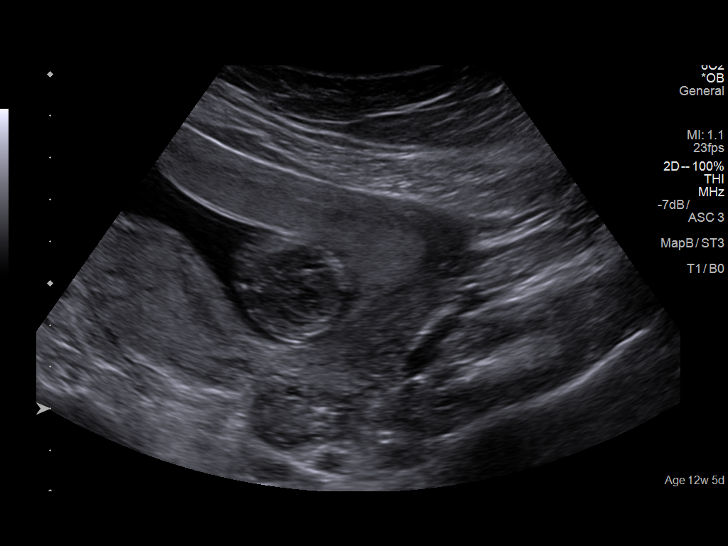
[im 39/44]
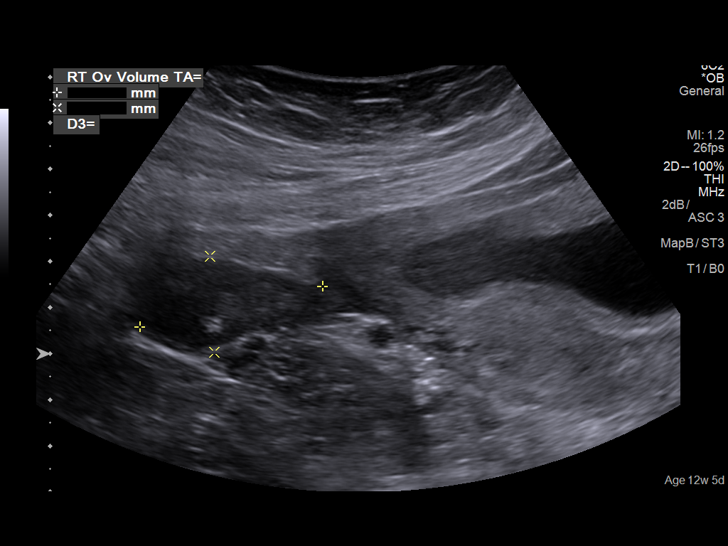
[im 42/44]
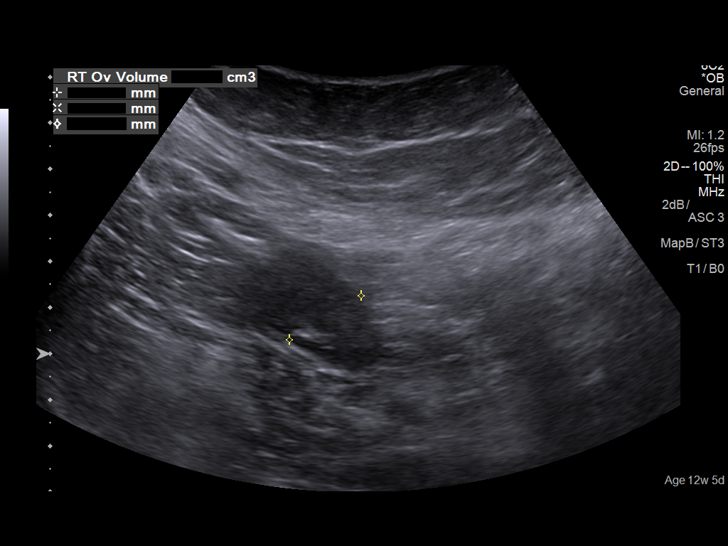

[13 of 28 positions shown; findings below may reference images not displayed]

IMPRESSION: Thank you for referring your patient for first trimester
 ultrasound.  She had the opportunity to meet with our genetic
 counselor today and elected to have cffDNA screening. .
 Please see that note for details.

 Ultrasound demonstrates a single, live, fetus at 12 weeks  5
 days. Dating is by LMP consistent with today's ultrasound.
 First trimester anatomic survey demonstrates a symmetric
 choroid plexus, stomach, and limbs.  The gestational age is
 too early for a complete anatomic survey.
 Maternal ovaries appear normal bilaterally.   A probable
 corpus luteum cyst is seen on the right ovary.
 We will collect blood for screening and notify you and the
 patient of the results.
 Thank you for allowing us to participate in your patient's care.
                  Robertsson, Nerciso

## 2021-06-04 IMAGING — US US MFM OB FOLLOW UP
1 series · 13 of 28 positions shown · non-contrast
Comparison: none

PATIENT INFO:

             [facility]
PERFORMED BY:
                   Sonographer                              FIOFOR
SERVICE(S) PROVIDED:
 ----------------------------------------------------------------------
INDICATIONS:
  20 weeks gestation of pregnancy
  Complete fetal anatomy
  Prior child with Trisomy 21 and congenital
  heart defect
FETAL EVALUATION:
 Num Of Fetuses:         1
 Fetal Heart Rate(bpm):  149
 Cardiac Activity:       Present
 Presentation:           Cephalic
 Placenta:               Fundal, No previa documented previously
 Amniotic Fluid
 AFI FV:      Within normal limits
GESTATIONAL AGE:
 LMP:           20w 5d        Date:  03/15/18                 EDD:   12/20/18
 Best:          20w 5d     Det. By:  LMP  (03/15/18)          EDD:   12/20/18
ANATOMY:
 Heart:                 4-Chamber view         Aortic Arch:            Normal appearance
                        appears normal
 Other:  Profile and Ao/Pa views appear normal
CERVIX UTERUS ADNEXA:
 Cervix
 Length:            3.9  cm.

[Series 1: us mfm ob follow up · 0.25mm/px · 13 of 52 slices shown]
[im 2/52]
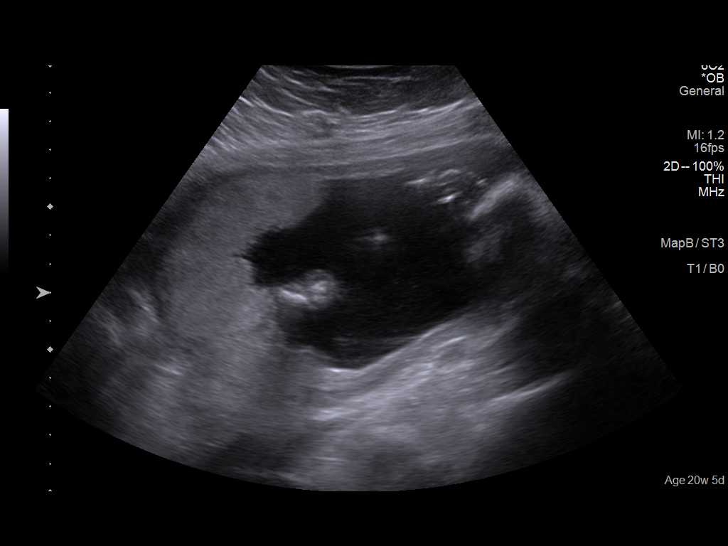
[im 6/52]
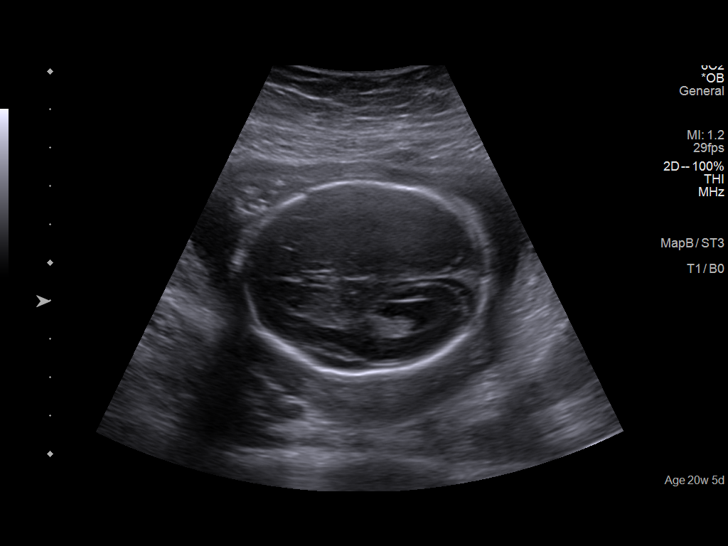
[im 10/52]
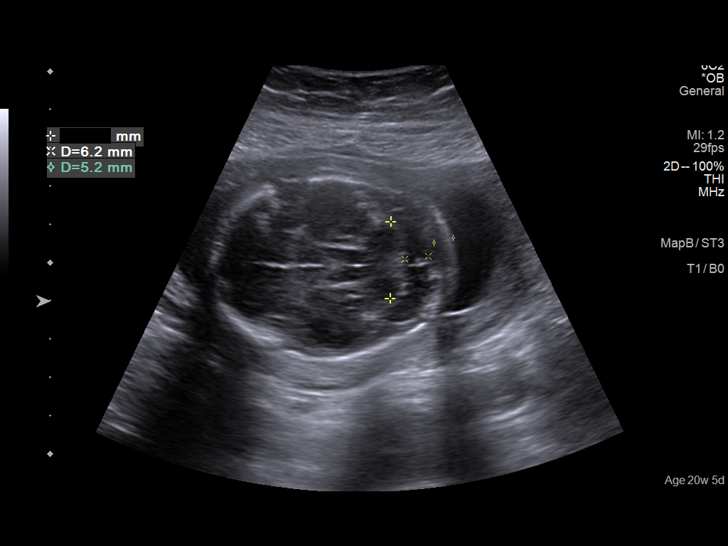
[im 14/52]
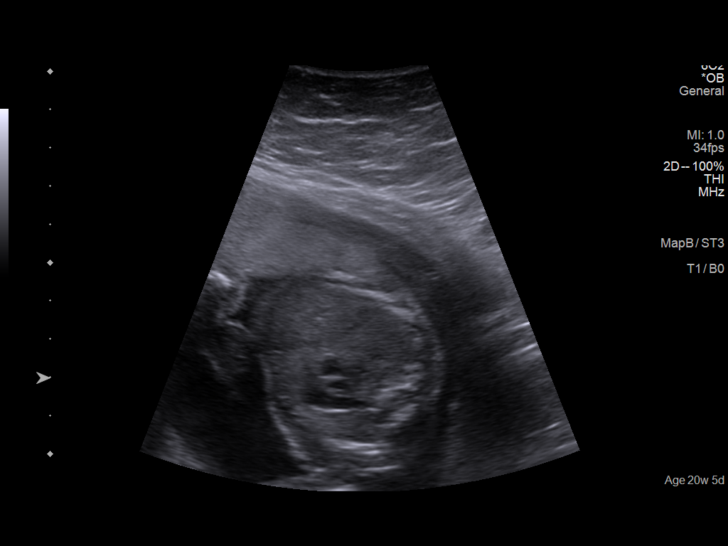
[im 18/52]
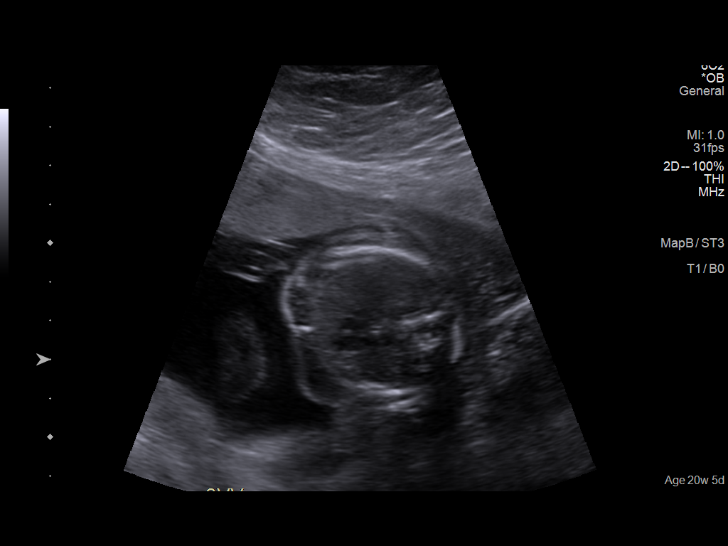
[im 21/52]
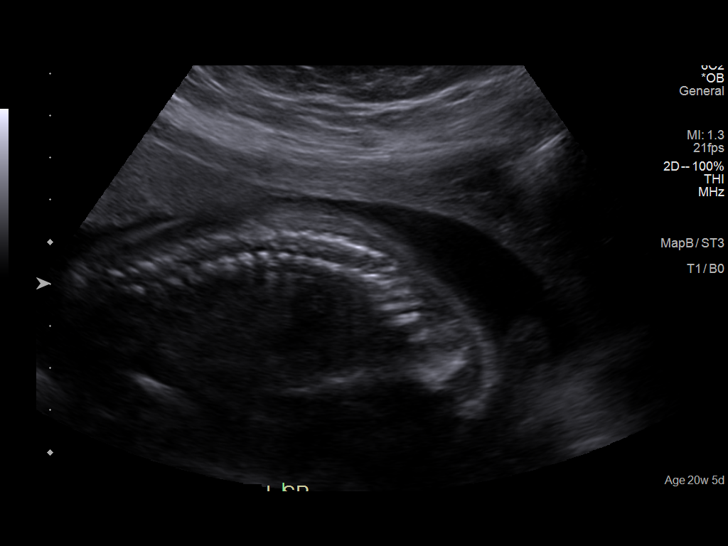
[im 27/52]
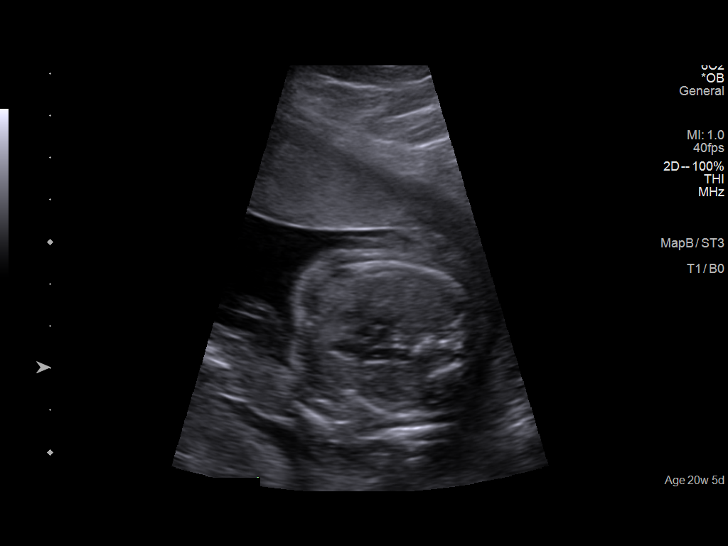
[im 31/52]
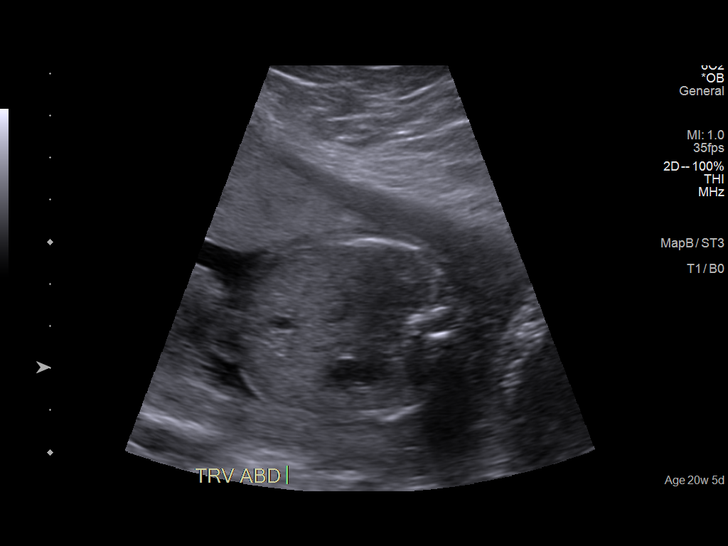
[im 35/52]
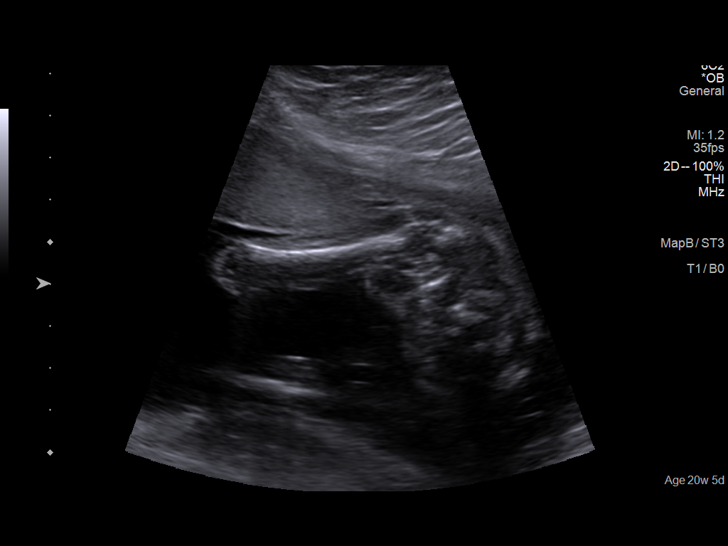
[im 38/52]
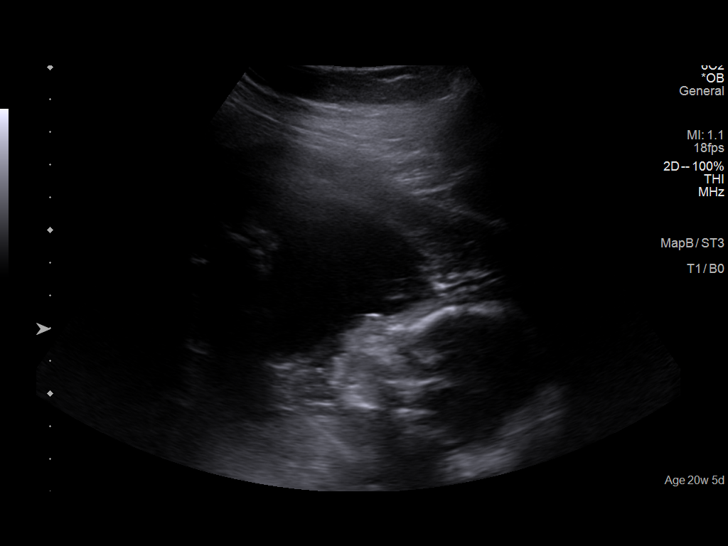
[im 42/52]
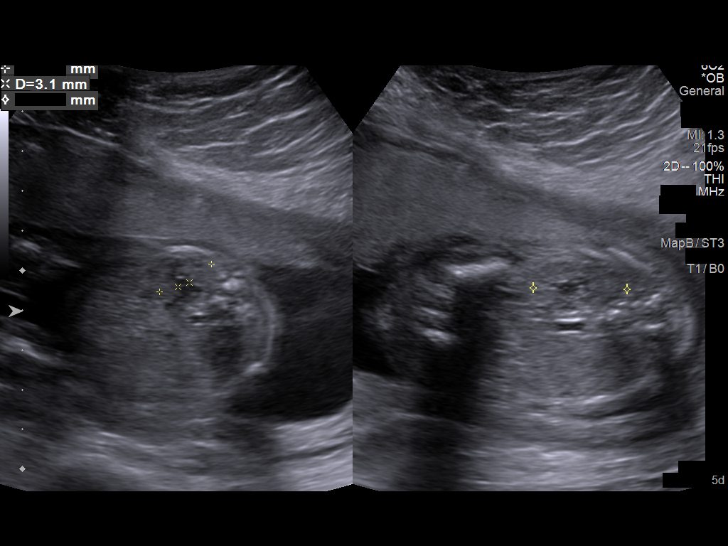
[im 46/52]
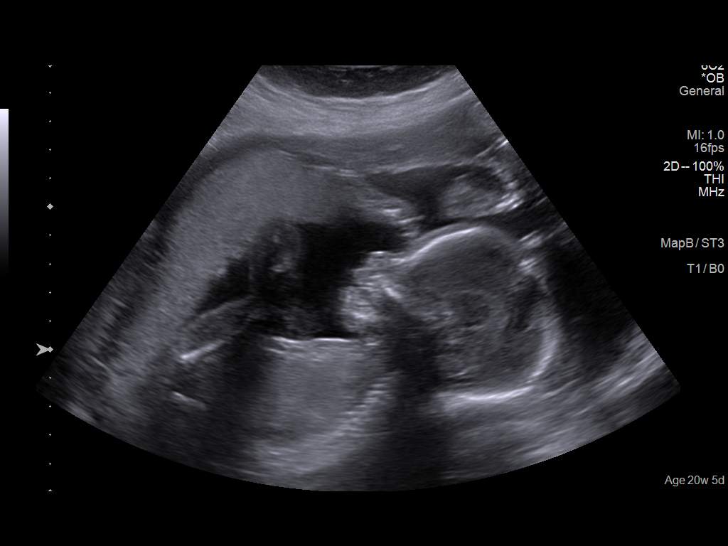
[im 50/52]
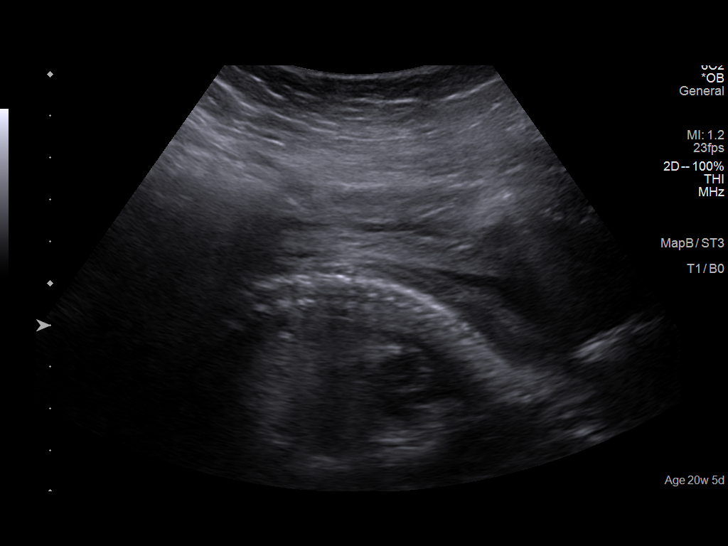

[13 of 28 positions shown; findings below may reference images not displayed]

IMPRESSION: Intrauterine pregnancy with a best estimated gestational age
 of 20 weeks 5 day.  Dating is based on LMP consistent with
 earliest available ultrasound performed at [facility]
 [[facility]OS[facility]] on 06/12/2018; measurements were reported as 13
 weeks 2 days.

 Ultrasound today performed to complete the fetal anatomy
 not previously documented.

 Four chamber, Ao/Pa , aortic arch and profile appear normal.
 Other fetal anatomy has been previously documented.

 Will schedule fetal echocardiogram given prior child with
 CHD.
 Findings were discussed with the aid of a Spanish Interpreter.
                 Nettles, Zelalem
